# Patient Record
Sex: Female | Born: 1960 | Race: White | Hispanic: No | Marital: Single | Smoking: Former smoker
Health system: Southern US, Community
[De-identification: ages and names within clinical notes are randomized; demographics above are authoritative.]

## PROBLEM LIST (undated history)

## (undated) DIAGNOSIS — T7840XA Allergy, unspecified, initial encounter: Secondary | ICD-10-CM

## (undated) DIAGNOSIS — N183 Chronic kidney disease, stage 3 unspecified: Secondary | ICD-10-CM

## (undated) DIAGNOSIS — S82899A Other fracture of unspecified lower leg, initial encounter for closed fracture: Secondary | ICD-10-CM

## (undated) DIAGNOSIS — S93439A Sprain of tibiofibular ligament of unspecified ankle, initial encounter: Secondary | ICD-10-CM

## (undated) HISTORY — PX: TONSILLECTOMY: SUR1361

## (undated) HISTORY — PX: NASAL SINUS SURGERY: SHX719

## (undated) HISTORY — DX: Allergy, unspecified, initial encounter: T78.40XA

## (undated) HISTORY — PX: SINUS IRRIGATION: SHX2411

---

## 1997-12-30 ENCOUNTER — Ambulatory Visit (HOSPITAL_COMMUNITY): Admission: RE | Admit: 1997-12-30 | Discharge: 1997-12-30 | Payer: Self-pay

## 1998-01-06 ENCOUNTER — Ambulatory Visit (HOSPITAL_COMMUNITY): Admission: RE | Admit: 1998-01-06 | Discharge: 1998-01-06 | Payer: Self-pay | Admitting: *Deleted

## 2001-01-20 ENCOUNTER — Encounter: Payer: Self-pay | Admitting: *Deleted

## 2001-01-20 ENCOUNTER — Ambulatory Visit (HOSPITAL_COMMUNITY): Admission: RE | Admit: 2001-01-20 | Discharge: 2001-01-20 | Payer: Self-pay | Admitting: *Deleted

## 2001-02-04 ENCOUNTER — Encounter: Payer: Self-pay | Admitting: *Deleted

## 2001-02-04 ENCOUNTER — Encounter: Admission: RE | Admit: 2001-02-04 | Discharge: 2001-02-04 | Payer: Self-pay | Admitting: *Deleted

## 2005-09-21 ENCOUNTER — Emergency Department (HOSPITAL_COMMUNITY): Admission: EM | Admit: 2005-09-21 | Discharge: 2005-09-21 | Payer: Self-pay | Admitting: Family Medicine

## 2006-04-11 ENCOUNTER — Ambulatory Visit: Payer: Self-pay | Admitting: Allergy and Immunology

## 2007-07-07 ENCOUNTER — Emergency Department (HOSPITAL_COMMUNITY): Admission: EM | Admit: 2007-07-07 | Discharge: 2007-07-07 | Payer: Self-pay | Admitting: Family Medicine

## 2011-10-30 ENCOUNTER — Encounter: Payer: Self-pay | Admitting: Family Medicine

## 2011-10-30 ENCOUNTER — Ambulatory Visit (INDEPENDENT_AMBULATORY_CARE_PROVIDER_SITE_OTHER): Payer: BC Managed Care – PPO | Admitting: Family Medicine

## 2011-10-30 VITALS — BP 142/90 | HR 76 | Temp 98.1°F | Ht 62.75 in | Wt 184.0 lb

## 2011-10-30 DIAGNOSIS — R030 Elevated blood-pressure reading, without diagnosis of hypertension: Secondary | ICD-10-CM

## 2011-10-30 DIAGNOSIS — T7840XA Allergy, unspecified, initial encounter: Secondary | ICD-10-CM | POA: Insufficient documentation

## 2011-10-30 DIAGNOSIS — Z136 Encounter for screening for cardiovascular disorders: Secondary | ICD-10-CM

## 2011-10-30 DIAGNOSIS — J309 Allergic rhinitis, unspecified: Secondary | ICD-10-CM

## 2011-10-30 DIAGNOSIS — Z0000 Encounter for general adult medical examination without abnormal findings: Secondary | ICD-10-CM

## 2011-10-30 DIAGNOSIS — J302 Other seasonal allergic rhinitis: Secondary | ICD-10-CM | POA: Insufficient documentation

## 2011-10-30 LAB — COMPREHENSIVE METABOLIC PANEL
ALT: 16 U/L (ref 0–35)
AST: 19 U/L (ref 0–37)
Albumin: 3.9 g/dL (ref 3.5–5.2)
Alkaline Phosphatase: 52 U/L (ref 39–117)
BUN: 11 mg/dL (ref 6–23)
CO2: 26 mEq/L (ref 19–32)
Calcium: 8.9 mg/dL (ref 8.4–10.5)
Chloride: 106 mEq/L (ref 96–112)
Creatinine, Ser: 1.1 mg/dL (ref 0.4–1.2)
GFR: 55.85 mL/min — ABNORMAL LOW (ref >60.00)
Glucose, Bld: 85 mg/dL (ref 70–99)
Potassium: 3.9 mEq/L (ref 3.5–5.1)
Sodium: 139 mEq/L (ref 135–145)
Total Bilirubin: 0.6 mg/dL (ref 0.3–1.2)
Total Protein: 6.7 g/dL (ref 6.0–8.3)

## 2011-10-30 LAB — LIPID PANEL
Cholesterol: 149 mg/dL (ref 0–200)
HDL: 61.6 mg/dL (ref >39.00)
LDL Cholesterol: 57 mg/dL (ref 0–99)
Total CHOL/HDL Ratio: 2
Triglycerides: 154 mg/dL — ABNORMAL HIGH (ref 0.0–149.0)
VLDL: 30.8 mg/dL (ref 0.0–40.0)

## 2011-10-30 NOTE — Progress Notes (Signed)
Subjective:    Patient ID: [name], female    DOB: 1960/12/19, 51 y.o.   MRN: [id number]  HPI  Very pleasant 51 yo female here to establish care.  Has not been to a physician in several years.  Overdue for all prevention but does not have time for CPX today.  Seasonal allergies- has been on Xyzal and Singulair for years.  Prone to bronchitis during peak allergy season.  Has been quite congested this season.  Elevated BP- never been told she has HTN. No HA, blurred vision, CP, LE edema or SOB.  Does have family h/o HTN.  Patient Active Problem List  Diagnoses  . Elevated blood pressure reading without diagnosis of hypertension  . Seasonal allergies  . Allergy   Past Medical History  Diagnosis Date  . Allergy    Past Surgical History  Procedure Date  . Cesarean section   . Nasal sinus surgery    History  Substance Use Topics  . Smoking status: Former Games developer  . Smokeless tobacco: Not on file   Comment: Quit age 32.  Marland Kitchen Alcohol Use: Not on file   Family History  Problem Relation Age of Onset  . Alzheimer's disease Mother   . Hyperlipidemia Mother   . Hypertension Mother    Allergies  Allergen Reactions  . Asa (Aspirin) Swelling    In throat  . Sulfa Antibiotics Hives  . Penicillins Hives   Current Outpatient Prescriptions on File Prior to Visit  Medication Sig Dispense Refill  . levocetirizine (XYZAL) 5 MG tablet Take 5 mg by mouth every evening.      . montelukast (SINGULAIR) 10 MG tablet Take 10 mg by mouth at bedtime.       The PMH, PSH, Social History, Family History, Medications, and allergies have been reviewed in [facility], and have been updated if relevant.    Review of Systems See HPI Patient reports no  vision/ hearing changes,anorexia, weight change, fever ,adenopathy, persistant / recurrent hoarseness, swallowing issues, chest pain, edema,persistant / recurrent cough, focal weakness, severe memory loss, concerning skin lesions, depression,  anxiety, abnormal bruising/bleeding, major joint swelling, breast masses or abnormal vaginal bleeding.       Objective:   Physical Exam  BP 142/90  Pulse 76  Temp 98.1 F (36.7 C)  Ht 5' 2.75" (1.594 m)  Wt 184 lb (83.462 kg)  BMI 32.85 kg/m2  LMP 10/18/2011  General:  Well-developed,well-nourished,in no acute distress; alert,appropriate and cooperative throughout examination Head:  normocephalic and atraumatic.   Eyes:  vision grossly intact, pupils equal, pupils round, and pupils reactive to light.   Ears:  R ear normal and L ear normal.   Nose:  no external deformity, pos nasal mucosal erythema, no obstruction Mouth:  good dentition.   Neck:  No deformities, masses, or tenderness noted. Lungs:  Normal respiratory effort, chest expands symmetrically. Lungs are clear to auscultation, no crackles or wheezes. Heart:  Normal rate and regular rhythm. S1 and S2 normal without gallop, murmur, click, rub or other extra sounds. Msk:  No deformity or scoliosis noted of thoracic or lumbar spine.   Extremities:  No clubbing, cyanosis, edema, or deformity noted with normal full range of motion of all joints.   Neurologic:  alert & oriented X3 and gait normal.   Skin:  Intact without suspicious lesions or rashes Psych:  Cognition and judgment appear intact. Alert and cooperative with normal attention span and concentration. No apparent delusions, illusions, hallucinations    Assessment &  Plan:   1. Elevated blood pressure reading without diagnosis of hypertension  New- ? Possible white coat HTN. Does not have BP cuff at home, asymptomatic with only one reading. Will recheck at CPX (she plans to schedule this in next few weeks).   2. Seasonal allergies  Deteriorated but she would like to continue her current medications.

## 2011-10-30 NOTE — Patient Instructions (Signed)
Please make an appointment on your way out for complete physical- we will do a pap smear at that time.

## 2011-11-14 ENCOUNTER — Other Ambulatory Visit: Payer: Self-pay | Admitting: Family Medicine

## 2011-11-14 DIAGNOSIS — Z0000 Encounter for general adult medical examination without abnormal findings: Secondary | ICD-10-CM

## 2011-11-14 DIAGNOSIS — R030 Elevated blood-pressure reading, without diagnosis of hypertension: Secondary | ICD-10-CM

## 2011-11-14 DIAGNOSIS — Z136 Encounter for screening for cardiovascular disorders: Secondary | ICD-10-CM

## 2011-11-20 ENCOUNTER — Other Ambulatory Visit: Payer: BC Managed Care – PPO

## 2011-11-27 ENCOUNTER — Ambulatory Visit (INDEPENDENT_AMBULATORY_CARE_PROVIDER_SITE_OTHER): Payer: BC Managed Care – PPO | Admitting: Family Medicine

## 2011-11-27 ENCOUNTER — Encounter: Payer: Self-pay | Admitting: Family Medicine

## 2011-11-27 VITALS — BP 120/84 | HR 64 | Ht 62.75 in | Wt 182.0 lb

## 2011-11-27 DIAGNOSIS — Z0000 Encounter for general adult medical examination without abnormal findings: Secondary | ICD-10-CM

## 2011-11-27 DIAGNOSIS — Z01419 Encounter for gynecological examination (general) (routine) without abnormal findings: Secondary | ICD-10-CM | POA: Insufficient documentation

## 2011-11-27 LAB — POCT URINALYSIS DIPSTICK
Bilirubin, UA: NEGATIVE
Glucose, UA: NEGATIVE
Ketones, UA: NEGATIVE
Leukocytes, UA: NEGATIVE
pH, UA: 6

## 2011-11-27 NOTE — Addendum Note (Signed)
Addended by: Eliezer Bottom on: 11/27/2011 09:49 AM   Modules accepted: Orders

## 2011-11-27 NOTE — Progress Notes (Signed)
Commercial Driver Medical Examination   [name] is a 51 y.o. female who presents today for a commercial driver fitness determination physical exam. The patient reports no problems.  Patient Active Problem List  Diagnosis  . Elevated blood pressure reading without diagnosis of hypertension  . Seasonal allergies  . Allergy  . Routine general medical examination at a health care facility  . Routine gynecological examination   Past Medical History  Diagnosis Date  . Allergy    Past Surgical History  Procedure Date  . Cesarean section   . Nasal sinus surgery    History  Substance Use Topics  . Smoking status: Former Games developer  . Smokeless tobacco: Not on file   Comment: Quit age 64.  Marland Kitchen Alcohol Use: Not on file   Family History  Problem Relation Age of Onset  . Alzheimer's disease Mother   . Hyperlipidemia Mother   . Hypertension Mother    Allergies  Allergen Reactions  . Asa (Aspirin) Swelling    In throat  . Sulfa Antibiotics Hives  . Penicillins Hives   Current Outpatient Prescriptions on File Prior to Visit  Medication Sig Dispense Refill  . levocetirizine (XYZAL) 5 MG tablet Take 5 mg by mouth every evening.      . montelukast (SINGULAIR) 10 MG tablet Take 10 mg by mouth at bedtime.       The PMH, PSH, Social History, Family History, Medications, and allergies have been reviewed in [facility], and have been updated if relevant.  Review of Systems A comprehensive review of systems was negative.   Objective:    Vision:  Uncorrected Corrected  Right Eye  20/20  Left Eye   20/20  Both Eyes   20/20   Applicant can recognize and distinguish among traffic control signals and devices showing standard red, green, and amber colors.  Applicant meets visual acuity requirement only when wearing corrective lenses.   Hearing:   500 Hz 1000 Hz 2000 Hz 4000 Hz  Right Ear  hears 20 dB hears 20 dB hears 20 dB hears 20 dB  Left Ear  hears 20 dB hears 20 dB hears 20 dB  hears 20 dB    BP 120/84  Pulse 64  Ht 5' 2.75" (1.594 m)  Wt 182 lb (82.555 kg)  BMI 32.50 kg/m2  LMP 10/18/2011  General Appearance:    Alert, cooperative, no distress, appears stated age  Head:    Normocephalic, without obvious abnormality, atraumatic  Eyes:    PERRL, conjunctiva/corneas clear, EOM's intact, fundi    benign, both eyes  Ears:    Normal TM's and external ear canals, both ears  Nose:   Nares normal, septum midline, mucosa normal, no drainage    or sinus tenderness  Throat:   Lips, mucosa, and tongue normal; teeth and gums normal  Neck:   Supple, symmetrical, trachea midline, no adenopathy;    thyroid:  no enlargement/tenderness/nodules; no carotid   bruit or JVD  Back:     Symmetric, no curvature, ROM normal, no CVA tenderness  Lungs:     Clear to auscultation bilaterally, respirations unlabored  Chest Wall:    No tenderness or deformity   Heart:    Regular rate and rhythm, S1 and S2 normal, no murmur, rub   or gallop  Extremities:   Extremities normal, atraumatic, no cyanosis or edema  Pulses:   2+ and symmetric all extremities  Skin:   Skin color, texture, turgor normal, no rashes or lesions  Lymph nodes:   Cervical, supraclavicular, and axillary nodes normal  Neurologic:   CNII-XII intact, normal strength, sensation and reflexes    throughout    Labs: UA neg    Assessment:    Healthy female exam.  Meets standards in 56 CFR 391.41;  qualifies for 2 year certificate.    Plan:    Medical examiners certificate completed and printed. Return as needed.

## 2017-01-24 ENCOUNTER — Encounter (HOSPITAL_COMMUNITY): Payer: Self-pay | Admitting: Pharmacy Technician

## 2017-01-24 ENCOUNTER — Encounter (HOSPITAL_COMMUNITY)
Admission: EM | Disposition: A | Discharge status: Home Health | Payer: Self-pay | Source: Home / Self Care | Attending: Orthopedic Surgery

## 2017-01-24 ENCOUNTER — Inpatient Hospital Stay (HOSPITAL_COMMUNITY)
Admission: EM | Admit: 2017-01-24 | Discharge: 2017-01-31 | DRG: 494 | Disposition: A | Discharge status: Home Health | Payer: Worker's Compensation | Attending: Orthopedic Surgery | Admitting: Orthopedic Surgery

## 2017-01-24 ENCOUNTER — Emergency Department (HOSPITAL_COMMUNITY): Payer: Worker's Compensation | Admitting: Anesthesiology

## 2017-01-24 ENCOUNTER — Emergency Department (HOSPITAL_COMMUNITY): Payer: Worker's Compensation

## 2017-01-24 DIAGNOSIS — Z880 Allergy status to penicillin: Secondary | ICD-10-CM

## 2017-01-24 DIAGNOSIS — Z886 Allergy status to analgesic agent status: Secondary | ICD-10-CM

## 2017-01-24 DIAGNOSIS — Z7951 Long term (current) use of inhaled steroids: Secondary | ICD-10-CM

## 2017-01-24 DIAGNOSIS — S82852B Displaced trimalleolar fracture of left lower leg, initial encounter for open fracture type I or II: Principal | ICD-10-CM | POA: Diagnosis present

## 2017-01-24 DIAGNOSIS — S8992XA Unspecified injury of left lower leg, initial encounter: Secondary | ICD-10-CM | POA: Diagnosis present

## 2017-01-24 DIAGNOSIS — Z885 Allergy status to narcotic agent status: Secondary | ICD-10-CM

## 2017-01-24 DIAGNOSIS — Z882 Allergy status to sulfonamides status: Secondary | ICD-10-CM

## 2017-01-24 DIAGNOSIS — Z419 Encounter for procedure for purposes other than remedying health state, unspecified: Secondary | ICD-10-CM

## 2017-01-24 DIAGNOSIS — Z23 Encounter for immunization: Secondary | ICD-10-CM

## 2017-01-24 DIAGNOSIS — S99912A Unspecified injury of left ankle, initial encounter: Secondary | ICD-10-CM | POA: Diagnosis not present

## 2017-01-24 DIAGNOSIS — S83412A Sprain of medial collateral ligament of left knee, initial encounter: Secondary | ICD-10-CM | POA: Diagnosis present

## 2017-01-24 DIAGNOSIS — S82899A Other fracture of unspecified lower leg, initial encounter for closed fracture: Secondary | ICD-10-CM

## 2017-01-24 HISTORY — DX: Other fracture of unspecified lower leg, initial encounter for closed fracture: S82.899A

## 2017-01-24 HISTORY — PX: I & D EXTREMITY: SHX5045

## 2017-01-24 LAB — CBC WITH DIFFERENTIAL/PLATELET
BASOS PCT: 1 %
Basophils Absolute: 0 10*3/uL (ref 0.0–0.1)
EOS ABS: 0.6 10*3/uL (ref 0.0–0.7)
EOS PCT: 9 %
HCT: 40.3 % (ref 36.0–46.0)
HEMOGLOBIN: 13.7 g/dL (ref 12.0–15.0)
Lymphocytes Relative: 30 %
Lymphs Abs: 2.1 10*3/uL (ref 0.7–4.0)
MCH: 27.6 pg (ref 26.0–34.0)
MCHC: 34 g/dL (ref 30.0–36.0)
MCV: 81.3 fL (ref 78.0–100.0)
MONOS PCT: 6 %
Monocytes Absolute: 0.4 10*3/uL (ref 0.1–1.0)
NEUTROS PCT: 54 %
Neutro Abs: 3.8 10*3/uL (ref 1.7–7.7)
PLATELETS: 239 10*3/uL (ref 150–400)
RBC: 4.96 MIL/uL (ref 3.87–5.11)
RDW: 13.1 % (ref 11.5–15.5)
WBC: 6.9 10*3/uL (ref 4.0–10.5)

## 2017-01-24 LAB — I-STAT BETA HCG BLOOD, ED (MC, WL, AP ONLY): I-stat hCG, quantitative: <5 m[IU]/mL (ref <5)

## 2017-01-24 LAB — I-STAT CHEM 8, ED
BUN: 16 mg/dL (ref 6–20)
Calcium, Ion: 1.14 mmol/L — ABNORMAL LOW (ref 1.15–1.40)
Chloride: 106 mmol/L (ref 101–111)
Creatinine, Ser: 1.3 mg/dL — ABNORMAL HIGH (ref 0.44–1.00)
Glucose, Bld: 139 mg/dL — ABNORMAL HIGH (ref 65–99)
HEMATOCRIT: 40 % (ref 36.0–46.0)
HEMOGLOBIN: 13.6 g/dL (ref 12.0–15.0)
Potassium: 3.2 mmol/L — ABNORMAL LOW (ref 3.5–5.1)
SODIUM: 142 mmol/L (ref 135–145)
TCO2: 23 mmol/L (ref 22–32)

## 2017-01-24 LAB — ETHANOL: Alcohol, Ethyl (B): <5 mg/dL (ref <5)

## 2017-01-24 LAB — TYPE AND SCREEN
ABO/RH(D): B POS
ANTIBODY SCREEN: NEGATIVE

## 2017-01-24 LAB — ABO/RH: ABO/RH(D): B POS

## 2017-01-24 SURGERY — IRRIGATION AND DEBRIDEMENT EXTREMITY
Anesthesia: General | Site: Ankle | Laterality: Left

## 2017-01-24 MED ORDER — CLINDAMYCIN PHOSPHATE 600 MG/50ML IV SOLN
600.0000 mg | Freq: Once | INTRAVENOUS | Status: AC
  Administered 2017-01-24: 600 mg via INTRAVENOUS
  Filled 2017-01-24: qty 50

## 2017-01-24 MED ORDER — HYDROMORPHONE HCL 1 MG/ML IJ SOLN
1.0000 mg | Freq: Once | INTRAMUSCULAR | Status: AC
  Administered 2017-01-24: 1 mg via INTRAVENOUS
  Filled 2017-01-24: qty 1

## 2017-01-24 MED ORDER — CLINDAMYCIN PHOSPHATE 900 MG/50ML IV SOLN
INTRAVENOUS | Status: AC
  Filled 2017-01-24: qty 50

## 2017-01-24 MED ORDER — PROPOFOL 10 MG/ML IV BOLUS
0.5000 mg/kg | Freq: Once | INTRAVENOUS | Status: DC
  Filled 2017-01-24: qty 20

## 2017-01-24 MED ORDER — FENTANYL CITRATE (PF) 250 MCG/5ML IJ SOLN
INTRAMUSCULAR | Status: DC | PRN
  Administered 2017-01-24 – 2017-01-25 (×2): 50 ug via INTRAVENOUS

## 2017-01-24 MED ORDER — MIDAZOLAM HCL 2 MG/2ML IJ SOLN
INTRAMUSCULAR | Status: AC
  Filled 2017-01-24: qty 2

## 2017-01-24 MED ORDER — LACTATED RINGERS IV SOLN
INTRAVENOUS | Status: DC | PRN
  Administered 2017-01-24 – 2017-01-25 (×3): via INTRAVENOUS

## 2017-01-24 MED ORDER — MIDAZOLAM HCL 5 MG/5ML IJ SOLN
INTRAMUSCULAR | Status: DC | PRN
  Administered 2017-01-24 (×2): 1 mg via INTRAVENOUS

## 2017-01-24 MED ORDER — FENTANYL CITRATE (PF) 250 MCG/5ML IJ SOLN
INTRAMUSCULAR | Status: AC
  Filled 2017-01-24: qty 5

## 2017-01-24 MED ORDER — PROPOFOL 10 MG/ML IV BOLUS
INTRAVENOUS | Status: AC | PRN
  Administered 2017-01-24: 70 mg via INTRAVENOUS

## 2017-01-24 SURGICAL SUPPLY — 80 items
BANDAGE ACE 3X5.8 VEL STRL LF (GAUZE/BANDAGES/DRESSINGS) IMPLANT
BIT DRILL 2.5X110 QC LCP DISP (BIT) ×2 IMPLANT
BIT DRILL CANN 2.7X625 NONSTRL (BIT) ×2 IMPLANT
BLADE SURG 10 STRL SS (BLADE) ×3 IMPLANT
BNDG COHESIVE 1X5 TAN STRL LF (GAUZE/BANDAGES/DRESSINGS) IMPLANT
BNDG COHESIVE 4X5 TAN STRL (GAUZE/BANDAGES/DRESSINGS) ×3 IMPLANT
BNDG COHESIVE 6X5 TAN STRL LF (GAUZE/BANDAGES/DRESSINGS) ×4 IMPLANT
BNDG CONFORM 3 STRL LF (GAUZE/BANDAGES/DRESSINGS) IMPLANT
BNDG GAUZE STRTCH 6 (GAUZE/BANDAGES/DRESSINGS) ×9 IMPLANT
CLOSURE STERI-STRIP 1/4X4 (GAUZE/BANDAGES/DRESSINGS) ×2 IMPLANT
CORDS BIPOLAR (ELECTRODE) IMPLANT
COVER SURGICAL LIGHT HANDLE (MISCELLANEOUS) ×3 IMPLANT
CUFF TOURNIQUET SINGLE 24IN (TOURNIQUET CUFF) IMPLANT
CUFF TOURNIQUET SINGLE 34IN LL (TOURNIQUET CUFF) ×4 IMPLANT
CUFF TOURNIQUET SINGLE 44IN (TOURNIQUET CUFF) IMPLANT
DRAPE EXTREMITY BILATERAL (DRAPES) IMPLANT
DRAPE IMP U-DRAPE 54X76 (DRAPES) IMPLANT
DRAPE INCISE IOBAN 66X45 STRL (DRAPES) ×4 IMPLANT
DRAPE SURG 17X23 STRL (DRAPES) IMPLANT
DRAPE U-SHAPE 47X51 STRL (DRAPES) ×3 IMPLANT
DRSG ADAPTIC 3X8 NADH LF (GAUZE/BANDAGES/DRESSINGS) ×2 IMPLANT
DURAPREP 26ML APPLICATOR (WOUND CARE) ×1 IMPLANT
ELECT CAUTERY BLADE 6.4 (BLADE) ×3 IMPLANT
ELECT REM PT RETURN 9FT ADLT (ELECTROSURGICAL) ×3
ELECTRODE REM PT RTRN 9FT ADLT (ELECTROSURGICAL) IMPLANT
FACESHIELD WRAPAROUND (MASK) IMPLANT
FACESHIELD WRAPAROUND OR TEAM (MASK) IMPLANT
GAUZE SPONGE 4X4 12PLY STRL (GAUZE/BANDAGES/DRESSINGS) ×4 IMPLANT
GAUZE XEROFORM 1X8 LF (GAUZE/BANDAGES/DRESSINGS) ×3 IMPLANT
GAUZE XEROFORM 5X9 LF (GAUZE/BANDAGES/DRESSINGS) ×3 IMPLANT
GLOVE BIO SURGEON STRL SZ7.5 (GLOVE) ×3 IMPLANT
GLOVE BIOGEL PI IND STRL 8 (GLOVE) ×1 IMPLANT
GLOVE BIOGEL PI INDICATOR 8 (GLOVE) ×2
GOWN STRL REUS W/ TWL LRG LVL3 (GOWN DISPOSABLE) ×2 IMPLANT
GOWN STRL REUS W/ TWL XL LVL3 (GOWN DISPOSABLE) ×1 IMPLANT
GOWN STRL REUS W/TWL LRG LVL3 (GOWN DISPOSABLE) ×6
GOWN STRL REUS W/TWL XL LVL3 (GOWN DISPOSABLE) ×3
HANDPIECE INTERPULSE COAX TIP (DISPOSABLE) ×3
K-WIRE 1.25 TRCR POINT 150 (WIRE) ×6
KIT BASIN OR (CUSTOM PROCEDURE TRAY) ×3 IMPLANT
KIT ROOM TURNOVER OR (KITS) ×3 IMPLANT
KWIRE 1.25 TRCR POINT 150 (WIRE) IMPLANT
MANIFOLD NEPTUNE II (INSTRUMENTS) ×3 IMPLANT
NS IRRIG 1000ML POUR BTL (IV SOLUTION) ×6 IMPLANT
PACK ORTHO EXTREMITY (CUSTOM PROCEDURE TRAY) ×3 IMPLANT
PAD ABD 8X10 STRL (GAUZE/BANDAGES/DRESSINGS) ×3 IMPLANT
PAD ARMBOARD 7.5X6 YLW CONV (MISCELLANEOUS) ×6 IMPLANT
PADDING CAST ABS 4INX4YD NS (CAST SUPPLIES) ×4
PADDING CAST ABS COTTON 4X4 ST (CAST SUPPLIES) ×2 IMPLANT
PADDING CAST COTTON 6X4 STRL (CAST SUPPLIES) ×3 IMPLANT
PLATE LCP 3.5 1/3 TUB 10HX117 (Plate) ×2 IMPLANT
SCREW CANC FT ST SFS 4X16 (Screw) ×2 IMPLANT
SCREW CORTEX 3.5 12MM (Screw) ×2 IMPLANT
SCREW CORTEX 3.5 14MM (Screw) ×8 IMPLANT
SCREW CORTEX 3.5 16MM (Screw) ×2 IMPLANT
SCREW LOCK CORT ST 3.5X12 (Screw) IMPLANT
SCREW LOCK CORT ST 3.5X14 (Screw) IMPLANT
SCREW LOCK CORT ST 3.5X16 (Screw) IMPLANT
SCREW SHORT THREAD 4.0X40 (Screw) ×4 IMPLANT
SET HNDPC FAN SPRY TIP SCT (DISPOSABLE) IMPLANT
SPLINT PLASTER EXTRA FAST 3X15 (CAST SUPPLIES) ×2
SPLINT PLASTER GYPS XFAST 3X15 (CAST SUPPLIES) IMPLANT
SPONGE LAP 18X18 X RAY DECT (DISPOSABLE) ×2 IMPLANT
STOCKINETTE IMPERVIOUS 9X36 MD (GAUZE/BANDAGES/DRESSINGS) ×3 IMPLANT
SUT ETHILON 2 0 FS 18 (SUTURE) IMPLANT
SUT ETHILON 2 0 PSLX (SUTURE) IMPLANT
SUT ETHILON 3 0 PS 1 (SUTURE) IMPLANT
SUT VIC AB 2-0 CT1 36 (SUTURE) IMPLANT
SUT VIC AB 2-0 FS1 27 (SUTURE) IMPLANT
SWAB CULTURE ESWAB REG 1ML (MISCELLANEOUS) IMPLANT
SYR CONTROL 10ML LL (SYRINGE) IMPLANT
TOWEL OR 17X24 6PK STRL BLUE (TOWEL DISPOSABLE) ×3 IMPLANT
TOWEL OR 17X26 10 PK STRL BLUE (TOWEL DISPOSABLE) ×3 IMPLANT
TUBE CONNECTING 12'X1/4 (SUCTIONS) ×1
TUBE CONNECTING 12X1/4 (SUCTIONS) ×2 IMPLANT
TUBE FEEDING ENTERAL 5FR 16IN (TUBING) IMPLANT
TUBING CYSTO DISP (UROLOGICAL SUPPLIES) ×3 IMPLANT
UNDERPAD 30X30 (UNDERPADS AND DIAPERS) ×6 IMPLANT
WATER STERILE IRR 1000ML POUR (IV SOLUTION) ×1 IMPLANT
YANKAUER SUCT BULB TIP NO VENT (SUCTIONS) ×3 IMPLANT
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
## 2017-01-24 NOTE — Anesthesia Preprocedure Evaluation (Addendum)
Anesthesia Evaluation  Patient identified by MRN, date of birth, ID band Patient awake    Reviewed: Allergy & Precautions, NPO status , Patient's Chart, lab work & pertinent test results  Airway Mallampati: II  TM Distance: >3 FB     Dental  (+) Teeth Intact, Dental Advisory Given   Pulmonary neg pulmonary ROS,    breath sounds clear to auscultation       Cardiovascular negative cardio ROS   Rhythm:Regular Rate:Normal     Neuro/Psych negative neurological ROS     GI/Hepatic negative GI ROS, Neg liver ROS,   Endo/Other  negative endocrine ROS  Renal/GU negative Renal ROS     Musculoskeletal   Abdominal   Peds  Hematology   Anesthesia Other Findings   Reproductive/Obstetrics                            Anesthesia Physical Anesthesia Plan  ASA: II and emergent  Anesthesia Plan: General   Post-op Pain Management:    Induction: Intravenous  PONV Risk Score and Plan: 3 and Ondansetron, Midazolam, Propofol infusion and Treatment may vary due to age or medical condition  Airway Management Planned: Oral ETT  Additional Equipment:   Intra-op Plan:   Post-operative Plan: Extubation in OR  Informed Consent: I have reviewed the patients History and Physical, chart, labs and discussed the procedure including the risks, benefits and alternatives for the proposed anesthesia with the patient or authorized representative who has indicated his/her understanding and acceptance.   Dental advisory given  Plan Discussed with: CRNA, Anesthesiologist and Surgeon  Anesthesia Plan Comments:        Anesthesia Quick Evaluation

## 2017-01-24 NOTE — Progress Notes (Signed)
   01/24/17 2020  Clinical Encounter Type  Visited With Patient and family together  Visit Type ED  Spiritual Encounters  Spiritual Needs Emotional  Stress Factors  Patient Stress Factors Health changes  Family Stress Factors None identified  Introduction to son as Pt going through procedures. No needs requested.

## 2017-01-25 ENCOUNTER — Emergency Department (HOSPITAL_COMMUNITY): Payer: Worker's Compensation

## 2017-01-25 ENCOUNTER — Inpatient Hospital Stay (HOSPITAL_COMMUNITY): Payer: Worker's Compensation

## 2017-01-25 DIAGNOSIS — S83412A Sprain of medial collateral ligament of left knee, initial encounter: Secondary | ICD-10-CM | POA: Diagnosis present

## 2017-01-25 DIAGNOSIS — Z882 Allergy status to sulfonamides status: Secondary | ICD-10-CM | POA: Diagnosis not present

## 2017-01-25 DIAGNOSIS — S8992XA Unspecified injury of left lower leg, initial encounter: Secondary | ICD-10-CM | POA: Diagnosis present

## 2017-01-25 DIAGNOSIS — Z23 Encounter for immunization: Secondary | ICD-10-CM | POA: Diagnosis not present

## 2017-01-25 DIAGNOSIS — S99912A Unspecified injury of left ankle, initial encounter: Secondary | ICD-10-CM | POA: Diagnosis present

## 2017-01-25 DIAGNOSIS — Z886 Allergy status to analgesic agent status: Secondary | ICD-10-CM | POA: Diagnosis not present

## 2017-01-25 DIAGNOSIS — S82852B Displaced trimalleolar fracture of left lower leg, initial encounter for open fracture type I or II: Secondary | ICD-10-CM | POA: Diagnosis present

## 2017-01-25 DIAGNOSIS — Z885 Allergy status to narcotic agent status: Secondary | ICD-10-CM | POA: Diagnosis not present

## 2017-01-25 DIAGNOSIS — Z7951 Long term (current) use of inhaled steroids: Secondary | ICD-10-CM | POA: Diagnosis not present

## 2017-01-25 DIAGNOSIS — Z880 Allergy status to penicillin: Secondary | ICD-10-CM | POA: Diagnosis not present

## 2017-01-25 HISTORY — PX: IRRIGATION AND DEBRIDEMENT FOOT: SHX6602

## 2017-01-25 LAB — CBC
HCT: 37 % (ref 36.0–46.0)
HEMOGLOBIN: 12.3 g/dL (ref 12.0–15.0)
MCH: 27.2 pg (ref 26.0–34.0)
MCHC: 33.2 g/dL (ref 30.0–36.0)
MCV: 81.7 fL (ref 78.0–100.0)
PLATELETS: 199 10*3/uL (ref 150–400)
RBC: 4.53 MIL/uL (ref 3.87–5.11)
RDW: 13.1 % (ref 11.5–15.5)
WBC: 9.3 10*3/uL (ref 4.0–10.5)

## 2017-01-25 LAB — BASIC METABOLIC PANEL
ANION GAP: 9 (ref 5–15)
BUN: 11 mg/dL (ref 6–20)
CHLORIDE: 107 mmol/L (ref 101–111)
CO2: 23 mmol/L (ref 22–32)
Calcium: 8.3 mg/dL — ABNORMAL LOW (ref 8.9–10.3)
Creatinine, Ser: 1.08 mg/dL — ABNORMAL HIGH (ref 0.44–1.00)
GFR calc Af Amer: >60 mL/min (ref >60)
GFR, EST NON AFRICAN AMERICAN: 55 mL/min — AB (ref >60)
GLUCOSE: 197 mg/dL — AB (ref 65–99)
POTASSIUM: 4 mmol/L (ref 3.5–5.1)
SODIUM: 139 mmol/L (ref 135–145)

## 2017-01-25 LAB — HIV ANTIBODY (ROUTINE TESTING W REFLEX): HIV Screen 4th Generation wRfx: NONREACTIVE

## 2017-01-25 MED ORDER — SUCCINYLCHOLINE CHLORIDE 20 MG/ML IJ SOLN
INTRAMUSCULAR | Status: DC | PRN
  Administered 2017-01-24: 120 mg via INTRAVENOUS

## 2017-01-25 MED ORDER — SENNA 8.6 MG PO TABS
1.0000 | ORAL_TABLET | Freq: Two times a day (BID) | ORAL | Status: DC
  Administered 2017-01-25 – 2017-01-31 (×13): 8.6 mg via ORAL
  Filled 2017-01-25 (×13): qty 1

## 2017-01-25 MED ORDER — FENTANYL CITRATE (PF) 100 MCG/2ML IJ SOLN
INTRAMUSCULAR | Status: AC
  Filled 2017-01-25: qty 2

## 2017-01-25 MED ORDER — MORPHINE SULFATE (PF) 2 MG/ML IV SOLN: 2.0000 mg | INTRAVENOUS | Status: DC | PRN

## 2017-01-25 MED ORDER — ONDANSETRON HCL 4 MG PO TABS: 4.0000 mg | ORAL_TABLET | Freq: Four times a day (QID) | ORAL | 0 refills | Status: DC | PRN

## 2017-01-25 MED ORDER — ONDANSETRON HCL 4 MG/2ML IJ SOLN
4.0000 mg | Freq: Four times a day (QID) | INTRAMUSCULAR | Status: DC | PRN
  Administered 2017-01-25: 4 mg via INTRAVENOUS
  Filled 2017-01-25: qty 2

## 2017-01-25 MED ORDER — METHOCARBAMOL 500 MG PO TABS
500.0000 mg | ORAL_TABLET | Freq: Four times a day (QID) | ORAL | Status: DC | PRN
  Administered 2017-01-25 – 2017-01-30 (×9): 500 mg via ORAL
  Filled 2017-01-25 (×10): qty 1

## 2017-01-25 MED ORDER — SODIUM CHLORIDE 0.9 % IR SOLN
Status: DC | PRN
  Administered 2017-01-25: 3000 mL

## 2017-01-25 MED ORDER — ACETAMINOPHEN 325 MG PO TABS
650.0000 mg | ORAL_TABLET | Freq: Four times a day (QID) | ORAL | Status: DC | PRN
  Administered 2017-01-25 – 2017-01-26 (×3): 650 mg via ORAL
  Filled 2017-01-25 (×5): qty 2

## 2017-01-25 MED ORDER — LIDOCAINE HCL (CARDIAC) 20 MG/ML IV SOLN
INTRAVENOUS | Status: DC | PRN
  Administered 2017-01-24: 100 mg via INTRATRACHEAL

## 2017-01-25 MED ORDER — ALBUTEROL SULFATE (2.5 MG/3ML) 0.083% IN NEBU: 3.0000 mL | INHALATION_SOLUTION | Freq: Four times a day (QID) | RESPIRATORY_TRACT | Status: DC | PRN

## 2017-01-25 MED ORDER — EPHEDRINE SULFATE 50 MG/ML IJ SOLN
INTRAMUSCULAR | Status: DC | PRN
  Administered 2017-01-25 (×2): 5 mg via INTRAVENOUS

## 2017-01-25 MED ORDER — ONDANSETRON HCL 4 MG/2ML IJ SOLN
INTRAMUSCULAR | Status: AC
  Administered 2017-01-25: 4 mg via INTRAVENOUS
  Filled 2017-01-25: qty 2

## 2017-01-25 MED ORDER — ACETAMINOPHEN 650 MG RE SUPP: 650.0000 mg | Freq: Four times a day (QID) | RECTAL | Status: DC | PRN

## 2017-01-25 MED ORDER — ONDANSETRON HCL 4 MG/2ML IJ SOLN
INTRAMUSCULAR | Status: DC | PRN
  Administered 2017-01-25: 4 mg via INTRAVENOUS

## 2017-01-25 MED ORDER — FENTANYL CITRATE (PF) 100 MCG/2ML IJ SOLN
25.0000 ug | INTRAMUSCULAR | Status: DC | PRN
  Administered 2017-01-25 (×2): 50 ug via INTRAVENOUS

## 2017-01-25 MED ORDER — ENOXAPARIN SODIUM 40 MG/0.4ML ~~LOC~~ SOLN: 40.0000 mg | SUBCUTANEOUS | 0 refills | Status: DC

## 2017-01-25 MED ORDER — METHOCARBAMOL 1000 MG/10ML IJ SOLN
500.0000 mg | Freq: Four times a day (QID) | INTRAVENOUS | Status: DC | PRN
  Filled 2017-01-25: qty 5

## 2017-01-25 MED ORDER — ENOXAPARIN SODIUM 40 MG/0.4ML ~~LOC~~ SOLN
40.0000 mg | SUBCUTANEOUS | Status: DC
  Administered 2017-01-25 – 2017-01-30 (×6): 40 mg via SUBCUTANEOUS
  Filled 2017-01-25 (×6): qty 0.4

## 2017-01-25 MED ORDER — CLINDAMYCIN PHOSPHATE 900 MG/50ML IV SOLN
INTRAVENOUS | Status: DC | PRN
  Administered 2017-01-25: 900 mg via INTRAVENOUS

## 2017-01-25 MED ORDER — PROPOFOL 10 MG/ML IV BOLUS
INTRAVENOUS | Status: DC | PRN
  Administered 2017-01-24: 170 mg via INTRAVENOUS

## 2017-01-25 MED ORDER — OXYCODONE HCL 5 MG PO TABS: 5.0000 mg | ORAL_TABLET | ORAL | 0 refills | Status: DC | PRN

## 2017-01-25 MED ORDER — OXYCODONE HCL 5 MG PO TABS
5.0000 mg | ORAL_TABLET | ORAL | Status: DC | PRN
  Administered 2017-01-25: 5 mg via ORAL
  Administered 2017-01-25 – 2017-01-27 (×8): 10 mg via ORAL
  Administered 2017-01-27: 5 mg via ORAL
  Administered 2017-01-27 – 2017-01-31 (×14): 10 mg via ORAL
  Filled 2017-01-25 (×12): qty 2
  Filled 2017-01-25: qty 1
  Filled 2017-01-25 (×13): qty 2

## 2017-01-25 MED ORDER — ONDANSETRON HCL 4 MG PO TABS: 4.0000 mg | ORAL_TABLET | Freq: Four times a day (QID) | ORAL | Status: DC | PRN

## 2017-01-25 MED ORDER — BISACODYL 10 MG RE SUPP: 10.0000 mg | Freq: Every day | RECTAL | Status: DC | PRN

## 2017-01-25 MED ORDER — PHENYLEPHRINE HCL 10 MG/ML IJ SOLN
INTRAMUSCULAR | Status: DC | PRN
  Administered 2017-01-25 (×2): 40 ug via INTRAVENOUS

## 2017-01-25 MED ORDER — MORPHINE SULFATE (PF) 4 MG/ML IV SOLN
2.0000 mg | INTRAVENOUS | Status: DC | PRN
  Administered 2017-01-25 – 2017-01-29 (×7): 2 mg via INTRAVENOUS
  Filled 2017-01-25 (×7): qty 1

## 2017-01-25 NOTE — Anesthesia Postprocedure Evaluation (Signed)
Anesthesia Post Note  Patient: Wynonia LawmanSharon Genther  Procedure(s) Performed: Procedure(s) (LRB): IRRIGATION AND DEBRIDEMENT LEFT ANKLE, POSSIBLE CLOSE REDUCTION, POSSIBLE ORIF (Left)     Patient location during evaluation: PACU Anesthesia Type: General Level of consciousness: awake Pain management: pain level controlled Vital Signs Assessment: post-procedure vital signs reviewed and stable Respiratory status: spontaneous breathing Cardiovascular status: stable Anesthetic complications: no    Last Vitals:  Vitals:   01/24/17 2220 01/24/17 2228  BP: 128/67 127/75  Pulse: 78 69  Resp: (!) 24 12  Temp:    SpO2: 100% 98%    Last Pain:  Vitals:   01/24/17 2032  TempSrc: Oral  PainSc: 10-Worst pain ever                 Ahaana Rochette

## 2017-01-25 NOTE — Anesthesia Procedure Notes (Signed)
Procedure Name: Intubation Date/Time: 01/25/2017 12:07 AM Performed by: Brien MatesMAHONY, Emelly Wurtz D Pre-anesthesia Checklist: Patient identified, Emergency Drugs available, Suction available, Patient being monitored and Timeout performed Patient Re-evaluated:Patient Re-evaluated prior to induction Oxygen Delivery Method: Circle system utilized Preoxygenation: Pre-oxygenation with 100% oxygen Induction Type: IV induction, Rapid sequence and Cricoid Pressure applied Laryngoscope Size: Miller and 2 Grade View: Grade I Tube type: Oral Tube size: 7.5 mm Number of attempts: 1 Airway Equipment and Method: Stylet Placement Confirmation: ETT inserted through vocal cords under direct vision,  positive ETCO2 and breath sounds checked- equal and bilateral Secured at: 21 cm Tube secured with: Tape Dental Injury: Teeth and Oropharynx as per pre-operative assessment

## 2017-01-25 NOTE — Transfer of Care (Signed)
Immediate Anesthesia Transfer of Care Note  Patient: [name]  Procedure(s) Performed: Procedure(s): IRRIGATION AND DEBRIDEMENT LEFT ANKLE, LEFT ANKLE ORIF (Left)  Patient Location: PACU  Anesthesia Type:General  Level of Consciousness: drowsy  Airway & Oxygen Therapy: Patient Spontanous Breathing and Patient connected to nasal cannula oxygen  Post-op Assessment: Report given to RN and Post -op Vital signs reviewed and stable  Post vital signs: Reviewed and stable  Last Vitals:  Vitals:   01/24/17 2220 01/24/17 2228  BP: 128/67 127/75  Pulse: 78 69  Resp: (!) 24 12  Temp:    SpO2: 100% 98%    Last Pain:  Vitals:   01/24/17 2032  TempSrc: Oral  PainSc: 10-Worst pain ever         Complications: No apparent anesthesia complications

## 2017-01-25 NOTE — Progress Notes (Signed)
OT Cancellation Note  Patient Details Name: Kelly LawmanSharon Muldrow MRN: [id number] DOB: 01/11/1961   Cancelled Treatment:    Reason Eval/Treat Not Completed: Patient at procedure or test/ unavailable. Pt leaving unit for test on OT arrival. Will check back as able to initiate evaluation.   Doristine Sectionharity A Tiawana Forgy, MS OTR/L  Pager: [phone]   Chari Parmenter A Ramiya Delahunty 01/25/2017, 12:08 PM
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
## 2017-01-25 NOTE — ED Notes (Signed)
Pt giving verbal consent for conscious sedation and reduction of L ankle to both Dr. Rubin PayorPickering and Dr. Aundria Rudogers.

## 2017-01-25 NOTE — Anesthesia Postprocedure Evaluation (Signed)
Anesthesia Post Note  Patient: [name]  Procedure(s) Performed: Procedure(s) (LRB): IRRIGATION AND DEBRIDEMENT LEFT ANKLE, LEFT ANKLE ORIF (Left)     Patient location during evaluation: PACU Anesthesia Type: General Level of consciousness: awake Pain management: pain level controlled Vital Signs Assessment: post-procedure vital signs reviewed and stable Respiratory status: spontaneous breathing Cardiovascular status: stable Anesthetic complications: no    Last Vitals:  Vitals:   01/24/17 2228 01/25/17 0210  BP: 127/75 (!) 144/83  Pulse: 69 96  Resp: 12 (!) 25  Temp:  (!) 36.4 C  SpO2: 98% 97%    Last Pain:  Vitals:   01/24/17 2032  TempSrc: Oral  PainSc: 10-Worst pain ever                 Clarabelle Oscarson

## 2017-01-25 NOTE — Brief Op Note (Signed)
01/24/2017 - 01/25/2017  2:13 AM  PATIENT:  [name]  56 y.o. female  PRE-OPERATIVE DIAGNOSIS:  OPEN FRACTURE, LEFT ANKLE  POST-OPERATIVE DIAGNOSIS:  OPEN FRACTURE, LEFT ANKLE  PROCEDURE:  Procedure(s): IRRIGATION AND DEBRIDEMENT LEFT ANKLE, LEFT ANKLE ORIF (Left)  SURGEON:  Surgeon(s) and Role:    * Yolonda Kidaogers, Jason Patrick, MD - Primary  PHYSICIAN ASSISTANT:   ASSISTANTS: none   ANESTHESIA:   general  EBL:  Total I/O In: 1900 [I.V.:1850; IV Piggyback:50] Out: 225 [Urine:200; Blood:25]  BLOOD ADMINISTERED:none  DRAINS: none   LOCAL MEDICATIONS USED:  NONE  SPECIMEN:  No Specimen  DISPOSITION OF SPECIMEN:  N/A  COUNTS:  YES  TOURNIQUET:   Total Tourniquet Time Documented: Thigh (Left) - 68 minutes Total: Thigh (Left) - 68 minutes   DICTATION: .Note written in EPIC  PLAN OF CARE: Admit to inpatient   PATIENT DISPOSITION:  PACU - hemodynamically stable.   Delay start of Pharmacological VTE agent (>24hrs) due to surgical blood loss or risk of bleeding: not applicable

## 2017-01-25 NOTE — Addendum Note (Signed)
Addendum  created 01/25/17 0355 by Dorris SinghGreen, Burnice Vassel, MD   Order sets accessed
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
## 2017-01-26 MED ORDER — LORATADINE 10 MG PO TABS
10.0000 mg | ORAL_TABLET | Freq: Every day | ORAL | Status: DC
  Administered 2017-01-26 – 2017-01-31 (×6): 10 mg via ORAL
  Filled 2017-01-26 (×6): qty 1

## 2017-01-26 MED ORDER — FLUTICASONE PROPIONATE 50 MCG/ACT NA SUSP
2.0000 | Freq: Every day | NASAL | Status: DC
  Administered 2017-01-26 – 2017-01-31 (×5): 2 via NASAL
  Filled 2017-01-26: qty 16
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
## 2017-01-27 ENCOUNTER — Encounter (HOSPITAL_COMMUNITY): Payer: Self-pay | Admitting: General Practice
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
## 2017-01-27 NOTE — Care Management Note (Signed)
Case Management Note  Patient Details  Name: [name] MRN: [id number] Date of Birth: Nov 25, 1960  Subjective/Objective:                    Action/Plan:  Patient has friend [name] who has 3 in 1 , walker and wheelchair patient can use at DC Expected Discharge Date:                  Expected Discharge Plan:  Home w Home Health Services  In-House Referral:     Discharge planning Services  CM Consult  Post Acute Care Choice:  Durable Medical Equipment, Home Health Choice offered to:  Patient  DME Arranged:    DME Agency:     HH Arranged:  PT, OT HH Agency:  [facility]  Status of Service:  Completed, signed off  If discussed at Long Length of Stay Meetings, dates discussed:    Additional Comments:  [name], Malonie Tatum Marie, RN 01/27/2017, 3:19 PM

## 2017-01-27 NOTE — Progress Notes (Signed)
   Subjective: 3 Days Post-Op Procedure(s) (LRB): IRRIGATION AND DEBRIDEMENT LEFT ANKLE, LEFT ANKLE ORIF (Left)  Pt c/o moderate to severe pain in the knee/ankle today MRI shows nondisplaced medial tibial plateau fracture with torn MCL and tibia non-displaced  PCL fracture Severe pain with trying to stand  Remains non weight bearing left lower extremity Patient reports pain as severe.  Objective:   VITALS:   Vitals:   01/26/17 2006 01/27/17 0450  BP: 124/70 132/71  Pulse: 84 85  Resp: 17 18  Temp: 99.6 F (37.6 C) 98.8 F (37.1 C)  SpO2: 99% 98%    Left knee with mild effusion and ecchymosis Left ankle in splint nv intact distally Mild edema to left foot/toes  LABS  Recent Labs  01/24/17 2033 01/24/17 2043 01/25/17 0345  HGB 13.7 13.6 12.3  HCT 40.3 40.0 37.0  WBC 6.9  --  9.3  PLT 239  --  199     Recent Labs  01/24/17 2043 01/25/17 0345  NA 142 139  K 3.2* 4.0  BUN 16 11  CREATININE 1.30* 1.08*  GLUCOSE 139* 197*     Assessment/Plan: 3 Days Post-Op Procedure(s) (LRB): IRRIGATION AND DEBRIDEMENT LEFT ANKLE, LEFT ANKLE ORIF (Left) Left ankle ORIF and nondispaced proximal posterior tibial medial plateau fx, MCL tear Continue strict non weight bearing Possible d/c tomorrow if pain control improves Continue therapy as tolerated lovenox and SCDs for DVT ppx   Yolonda KidaJason Patrick Nasreen Goedecke   01/27/2017, 1:58 PM

## 2017-01-27 NOTE — Progress Notes (Signed)
PT Cancellation Note  Patient Details Name: Kelly LawmanSharon Huynh MRN: [id number] DOB: 09-Jul-1960   Cancelled Treatment:    Reason Eval/Treat Not Completed: (P) Pain limiting ability to participate (Pt reports intolerable pain from previous mobility from bed to chair.  She reports she is just not getting relief but agreeable to attempt PT later this pm.  Will f/u.  )   Mariska Daffin Artis DelayJ Rosangelica Pevehouse 01/27/2017, 12:11 PM  Joycelyn RuaAimee Uniqua Kihn, PTA pager [phone]

## 2017-01-27 NOTE — Progress Notes (Signed)
    Durable Medical Equipment        Start     Ordered   01/27/17 [phone] [id number]  For home use only DME 3 n 1  Once     01/27/17 [id number]   01/27/17 0830  For home use only DME Walker rolling  Once    Question:  Patient needs a walker to treat with the following condition  Answer:  Open fracture of left ankle   01/27/17 0830   01/27/17 0827  For home use only DME lightweight manual wheelchair with seat cushion  Once    Comments:  Patient suffers from IRRIGATION AND DEBRIDEMENT LEFT ANKLE, LEFT ANKLE ORIF which impairs their ability to perform daily activities like  Ambulating  in the home.  A cane will not resolve  issue with performing activities of daily living. A wheelchair will allow patient to safely perform daily activities. Patient is not able to propel themselves in the home using a standard weight wheelchair due to IRRIGATION AND DEBRIDEMENT LEFT ANKLE, LEFT ANKLE ORIF  . Patient can self propel in the lightweight wheelchair.  Accessories: elevating leg rests (ELRs), wheel locks, extensions and anti-tippers. ;Wheelchair (measurements PT);Other (comment) (elevated leg rests)     01/27/17 0830     Ina HomesJaclyn Mairlyn Tegtmeyer, PT, DPT Acute Rehab Services  Pager: [phone]  .

## 2017-01-27 NOTE — Care Management Note (Signed)
Case Management Note  Patient Details  Name: Wynonia LawmanSharon Ledwith MRN: [id number] Date of Birth: 12/24/1960  Subjective/Objective:                    Action/Plan:  PT recommendations HHPT, walker and wheelchair.  Will need NCM progress note and orders for DME and HHPT signed by MD/PA  Thanks Ronny FlurryHeather Janelys Glassner RN [phone]  Expected Discharge Date:                  Expected Discharge Plan:  Home w Home Health Services  In-House Referral:     Discharge planning Services  CM Consult  Post Acute Care Choice:  Durable Medical Equipment, Home Health Choice offered to:     DME Arranged:    DME Agency:     HH Arranged:    HH Agency:     Status of Service:  In process, will continue to follow  If discussed at Long Length of Stay Meetings, dates discussed:    Additional Comments:  [name], Jericca Russett Marie, RN 01/27/2017, 8:30 AM
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
## 2017-01-27 NOTE — Progress Notes (Signed)
   Subjective: 3 Days Post-Op Procedure(s) (LRB): IRRIGATION AND DEBRIDEMENT LEFT ANKLE, LEFT ANKLE ORIF (Left)  Pt c/o moderate to severe pain in the knee/ankle today MRI shows nondisplaced medial tibial plateau fracture with torn MCL and and PCL Severe pain with trying to stand  Remains non weight bearing left lower extremity Patient reports pain as severe.  Objective:   VITALS:   Vitals:   01/26/17 2006 01/27/17 0450  BP: 124/70 132/71  Pulse: 84 85  Resp: 17 18  Temp: 99.6 F (37.6 C) 98.8 F (37.1 C)  SpO2: 99% 98%    Left knee with mild effusion and ecchymosis Left ankle in splint nv intact distally Mild edema to left foot/toes  LABS  Recent Labs  01/24/17 2033 01/24/17 2043 01/25/17 0345  HGB 13.7 13.6 12.3  HCT 40.3 40.0 37.0  WBC 6.9  --  9.3  PLT 239  --  199     Recent Labs  01/24/17 2043 01/25/17 0345  NA 142 139  K 3.2* 4.0  BUN 16 11  CREATININE 1.30* 1.08*  GLUCOSE 139* 197*     Assessment/Plan: 3 Days Post-Op Procedure(s) (LRB): IRRIGATION AND DEBRIDEMENT LEFT ANKLE, LEFT ANKLE ORIF (Left) Left ankle ORIF and left tibial plateau fracture Continue strict non weight bearing Possible d/c tomorrow if pain control improves Continue therapy as tolerated    Alphonsa OverallBrad Dixon, MPAS, PA-C  01/27/2017, 10:41 AM

## 2017-01-27 NOTE — Progress Notes (Signed)
PT Cancellation Note  Patient Details Name: [name] MRN: [id number] DOB: August 13, 1960   Cancelled Treatment:    Reason Eval/Treat Not Completed: Pain limiting ability to participate. Pt reports continuing to have intolerable pain with mobility and will be unable to mobilize or perform seated/supine therex with therapy. Will check back as time allows this afternoon.  [name], PT, DPT Acute Rehab Services  Pager: [phone]  [name] 01/27/2017, 2:11 PM

## 2017-01-27 NOTE — Progress Notes (Signed)
    Subjective: 3 Days Post-Op Procedure(s) (LRB): IRRIGATION AND DEBRIDEMENT LEFT ANKLE, LEFT ANKLE ORIF (Left) Patient reports pain as 5 on 0-10 scale.   Denies CP or SOB.  Voiding without difficulty. Positive flatus. Objective: Vital signs in last 24 hours: Temp:  [98.5 F (36.9 C)-99.6 F (37.6 C)] 98.8 F (37.1 C) (09/03 0450) Pulse Rate:  [84-89] 85 (09/03 0450) Resp:  [17-18] 18 (09/03 0450) BP: (124-138)/(70-79) 132/71 (09/03 0450) SpO2:  [98 %-99 %] 98 % (09/03 0450)  Intake/Output from previous day: 09/02 0701 - 09/03 0700 In: 720 [P.O.:720] Out: 950 [Urine:950] Intake/Output this shift: No intake/output data recorded.  Labs:  Recent Labs  01/24/17 2033 01/24/17 2043 01/25/17 0345  HGB 13.7 13.6 12.3    Recent Labs  01/24/17 2033 01/24/17 2043 01/25/17 0345  WBC 6.9  --  9.3  RBC 4.96  --  4.53  HCT 40.3 40.0 37.0  PLT 239  --  199    Recent Labs  01/24/17 2043 01/25/17 0345  NA 142 139  K 3.2* 4.0  CL 106 107  CO2  --  23  BUN 16 11  CREATININE 1.30* 1.08*  GLUCOSE 139* 197*  CALCIUM  --  8.3*   No results for input(s): LABPT, INR in the last 72 hours.  Physical Exam: Neurologically intact Intact pulses distally Compartment soft splint in good condition  Assessment/Plan: 3 Days Post-Op Procedure(s) (LRB): IRRIGATION AND DEBRIDEMENT LEFT ANKLE, LEFT ANKLE ORIF (Left) Advance diet Up with therapy NWB LLE lovenox and SCDs for DVT ppx  MRI left knee done, complete proximal MCL tear with non-displaced posterior tibial eminence fracture at PCL attachment and posterior horn medial meniscus.  Will plan for non op of these injuries.  Will need different bracing once SL splint is off, which will make bracing difficult at this time.  [name] f

## 2017-01-28 NOTE — Progress Notes (Signed)
   Subjective: 4 Days Post-Op Procedure(s) (LRB): IRRIGATION AND DEBRIDEMENT LEFT ANKLE, LEFT ANKLE ORIF (Left)  Pt c/o moderate  pain in the knee/ankle today Severe pain with trying to stand  Remains non weight bearing left lower extremity Patient reports pain as severe.  Objective:   VITALS:   Vitals:   01/27/17 2041 01/28/17 0452  BP: 124/71 127/71  Pulse: 86 89  Resp: 17 20  Temp: 99.3 F (37.4 C) 99.6 F (37.6 C)  SpO2: 99% 97%    Left knee with mild effusion and ecchymosis Left ankle in splint +SILT dp/sp Wiggles toes Skin WWP with cap refill < 2 s Mild edema to left foot/toes  LABS No results for input(s): HGB, HCT, WBC, PLT in the last 72 hours.  No results for input(s): NA, K, BUN, CREATININE, GLUCOSE in the last 72 hours.   Assessment/Plan: 4 Days Post-Op Procedure(s) (LRB): IRRIGATION AND DEBRIDEMENT LEFT ANKLE, LEFT ANKLE ORIF (Left) Left ankle ORIF and nondispaced proximal posterior tibial medial plateau fx, MCL tear Continue strict non weight bearing, will need transition to knee brace after splint off ankle and for when WB begins. Still having issues with pain when out of bed, will watch today and tonight and hopefully dc tomorrow Continue therapy as tolerated lovenox and SCDs for DVT ppx   Yolonda KidaJason Patrick Rogers   01/28/2017, 7:27 AM

## 2017-01-28 NOTE — Progress Notes (Signed)
Orthopedic Tech Progress Note Patient Details:  Kelly LawmanSharon Huynh May 23, 1961 [id number]  Ortho Devices Type of Ortho Device: Knee Immobilizer Ortho Device/Splint Interventions: Application   Saul FordyceJennifer C Masa Lubin 01/28/2017, 9:37 AM
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
## 2017-01-30 MED ORDER — ENOXAPARIN (LOVENOX) PATIENT EDUCATION KIT
PACK | Freq: Once | Status: AC
  Administered 2017-01-30: 17:00:00
  Filled 2017-01-30: qty 1

## 2017-01-30 MED ORDER — METHOCARBAMOL 500 MG PO TABS: 500.0000 mg | ORAL_TABLET | Freq: Four times a day (QID) | ORAL | 1 refills | Status: DC | PRN
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
## 2017-01-30 NOTE — Progress Notes (Signed)
Patient's discharge completed, IV removed, lovenox teaching kit, discharge instructions, and prescriptions given to patient. Home health not yet set up. Called ED case manager who came up and spoke with patient. Patient stated worker's comp case manager told her to stay if everything was not set up and she did not have her equipment. Patient needs walker, 3-in-1, and wheelchair that was not in room. Patient's case manager came to her room around 1700, but had to leave. Stated she needs DME equipment orders faxed to her. Danley Danker office: [phone] fax: [phone]. Report given to night shift RN, Louie Casa.

## 2017-01-30 NOTE — Discharge Instructions (Signed)
-   no weightbearing to the left lower extremity. - Elevate the left lower extremity at all times when able. - When out of bed where the knee immobilizer to the left knee. - For prevention of blood clots administer one injection with Lovenox 40 mg once daily for 1 month. - For pain take Tylenol 1000 mg every 6 hours, for increased pain above that use oxycodone 5-10 mg every 4-6 hours as needed. Maintain ice to the ankle and knee as needed. - Keep your left ankle splint clean and dry. - Return to the office to see Dr. Aundria Rudogers in 2 weeks.

## 2017-01-30 NOTE — Care Management (Signed)
ED CM received call from [facility] on 5N concerning patient being discharged this evening and needing DME equipment and [place] arranged.  Recommendation by PT is for a w/c. CM met with patient met with patient to explained that most DME companies are closed so if would not be likely that she would receive DME tonight. Patient verbalized understanding and is agreeable about waiting until the am. Vicente Males RN updated.  CM will leave handoff for Ricki Miller CM on 5W to follow up in the am.
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
## 2017-01-31 NOTE — Care Management (Addendum)
Case manager notified today that patient is under worker's comp, this was first mention of WC since patient was admitted. CM has faxed orders to Eulah PontMichelle Dominguez at [phone]. Patient says she notified someone. CM is working with [facility] Liaison to secure DME for patient's discharge  12:01pm - Case manager spoke with Eulah PontMichelle Dominguez, who states she was only assigned patient on 01/30/17. CM has faxed orders and is waiting for return call concerning DME.
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
## 2017-01-31 NOTE — Progress Notes (Signed)
Reviewed discharge paperwork with patient and prescriptions. All equipment sent home with patient, w/c, walker and 3-in-1. Patient and son have full understanding of medications and discharge paperwork
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
## 2017-02-03 ENCOUNTER — Encounter: Payer: Self-pay | Admitting: Family Medicine
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
## 2017-02-20 ENCOUNTER — Encounter (HOSPITAL_COMMUNITY): Payer: Self-pay

## 2017-02-20 ENCOUNTER — Encounter (HOSPITAL_COMMUNITY)
Admission: RE | Admit: 2017-02-20 | Discharge: 2017-02-20 | Disposition: A | Discharge status: Home / Self Care | Payer: Worker's Compensation | Source: Ambulatory Visit | Attending: Orthopedic Surgery | Admitting: Orthopedic Surgery

## 2017-02-20 DIAGNOSIS — Z79899 Other long term (current) drug therapy: Secondary | ICD-10-CM | POA: Diagnosis not present

## 2017-02-20 DIAGNOSIS — Z8249 Family history of ischemic heart disease and other diseases of the circulatory system: Secondary | ICD-10-CM | POA: Diagnosis not present

## 2017-02-20 DIAGNOSIS — Z7901 Long term (current) use of anticoagulants: Secondary | ICD-10-CM | POA: Diagnosis not present

## 2017-02-20 DIAGNOSIS — S93432A Sprain of tibiofibular ligament of left ankle, initial encounter: Secondary | ICD-10-CM | POA: Diagnosis not present

## 2017-02-20 DIAGNOSIS — Z6830 Body mass index (BMI) 30.0-30.9, adult: Secondary | ICD-10-CM | POA: Diagnosis not present

## 2017-02-20 DIAGNOSIS — Z886 Allergy status to analgesic agent status: Secondary | ICD-10-CM | POA: Diagnosis not present

## 2017-02-20 DIAGNOSIS — Z880 Allergy status to penicillin: Secondary | ICD-10-CM | POA: Diagnosis not present

## 2017-02-20 DIAGNOSIS — Z885 Allergy status to narcotic agent status: Secondary | ICD-10-CM | POA: Diagnosis not present

## 2017-02-20 DIAGNOSIS — Z87891 Personal history of nicotine dependence: Secondary | ICD-10-CM | POA: Diagnosis not present

## 2017-02-20 DIAGNOSIS — E669 Obesity, unspecified: Secondary | ICD-10-CM | POA: Diagnosis not present

## 2017-02-20 HISTORY — DX: Sprain of tibiofibular ligament of unspecified ankle, initial encounter: S93.439A

## 2017-02-20 LAB — BASIC METABOLIC PANEL
ANION GAP: 9 (ref 5–15)
BUN: 13 mg/dL (ref 6–20)
CALCIUM: 9.3 mg/dL (ref 8.9–10.3)
CHLORIDE: 103 mmol/L (ref 101–111)
CO2: 25 mmol/L (ref 22–32)
Creatinine, Ser: 1.43 mg/dL — ABNORMAL HIGH (ref 0.44–1.00)
GFR calc non Af Amer: 40 mL/min — ABNORMAL LOW (ref >60)
GFR, EST AFRICAN AMERICAN: 46 mL/min — AB (ref >60)
GLUCOSE: 95 mg/dL (ref 65–99)
POTASSIUM: 3.7 mmol/L (ref 3.5–5.1)
Sodium: 137 mmol/L (ref 135–145)

## 2017-02-20 LAB — SURGICAL PCR SCREEN
MRSA, PCR: NEGATIVE
Staphylococcus aureus: NEGATIVE

## 2017-02-20 LAB — CBC
HEMATOCRIT: 38.5 % (ref 36.0–46.0)
Hemoglobin: 12.6 g/dL (ref 12.0–15.0)
MCH: 27.2 pg (ref 26.0–34.0)
MCHC: 32.7 g/dL (ref 30.0–36.0)
MCV: 83.2 fL (ref 78.0–100.0)
Platelets: 279 10*3/uL (ref 150–400)
RBC: 4.63 MIL/uL (ref 3.87–5.11)
RDW: 13.2 % (ref 11.5–15.5)
WBC: 5.2 10*3/uL (ref 4.0–10.5)

## 2017-02-20 NOTE — Progress Notes (Signed)
PCP - Dr. Enos Fling  Cardiologist - Denies  Chest x-ray - 01/24/17  EKG - Denies  Stress Test - Denies  ECHO - Denies  Cardiac Cath - Denies  Sleep Study - No CPAP - None Message left for Sherri at Dr. William Hamburger office regarding the Lovenox regimen. Pt also advised to call the office for clarification.  Pt denies having chest pain, sob, or fever at this time. All instructions explained to the pt, with a verbal understanding of the material. Pt agrees to go over the instructions while at home for a better understanding. The opportunity to ask questions was provided.

## 2017-02-20 NOTE — Pre-Procedure Instructions (Signed)
[name]  02/20/2017      [id number] - [place], [place] - [address] [place] [address] Phone: [phone] Fax: [phone]    Your procedure is scheduled on Friday, February 21, 2017  Report to [facility] Admitting Entrance "A" at [employer] this number if you have problems the morning of surgery:  [phone]   Remember:  Do not eat food or drink liquids after midnight.  Take these medicines the morning of surgery with A SIP OF WATER: If needed OxyCODONE (OXY IR/ROXICODONE) for pain, Naphazoline-Pheniramine (OPCON-A) for eye allergies, Mometasone (NASONEX) for nasal allergies, and Albuterol (PROAIR HFA) inhaler for cough or wheezing (bring with you the day of surgery).  As of today, stop taking all Aspirins, Vitamins, Fish oils, and Herbal medications. Also stop all NSAIDS i.e. Advil, Motrin, Aleve, Anaprox, Naproxen, BC and Goody Powders.   Do not wear jewelry, make-up or nail polish.  Do not wear lotions, powders, or perfumes, or deodorant.  Do not shave 48 hours prior to surgery.    Do not bring valuables to the hospital.  [facility] is not responsible for any belongings or valuables.  Contacts, dentures or bridgework may not be worn into surgery.  Leave your suitcase in the car.  After surgery it may be brought to your room.  For patients admitted to the hospital, discharge time will be determined by your treatment team.  Patients discharged the day of surgery will not be allowed to drive home.   Special instructions:  [facility]- Preparing For Surgery  Before surgery, you can play an important role. Because skin is not sterile, your skin needs to be as free of germs as possible. You can reduce the number of germs on your skin by washing with CHG (chlorahexidine gluconate) Soap before surgery.  CHG is an antiseptic cleaner which kills germs and bonds with the skin to continue killing germs even after  washing.  Please do not use if you have an allergy to CHG or antibacterial soaps. If your skin becomes reddened/irritated stop using the CHG.  Do not shave (including legs and underarms) for at least 48 hours prior to first CHG shower. It is OK to shave your face.  Please follow these instructions carefully.   1. Shower the NIGHT BEFORE SURGERY and the MORNING OF SURGERY with CHG.   2. If you chose to wash your hair, wash your hair first as usual with your normal shampoo.  3. After you shampoo, rinse your hair and body thoroughly to remove the shampoo.  4. Use CHG as you would any other liquid soap. You can apply CHG directly to the skin and wash gently with a scrungie or a clean washcloth.   5. Apply the CHG Soap to your body ONLY FROM THE NECK DOWN.  Do not use on open wounds or open sores. Avoid contact with your eyes, ears, mouth and genitals (private parts). Wash genitals (private parts) with your normal soap.  6. Wash thoroughly, paying special attention to the area where your surgery will be performed.  7. Thoroughly rinse your body with warm water from the neck down.  8. DO NOT shower/wash with your normal soap after using and rinsing off the CHG Soap.  9. Pat yourself dry with a CLEAN TOWEL.   10. Wear CLEAN PAJAMAS   11. Place CLEAN SHEETS on your bed the night of your first shower and DO NOT SLEEP WITH  PETS.  Day of Surgery: Do not apply any deodorants/lotions. Please wear clean clothes to the hospital/surgery center.    Please read over the following fact sheets that you were given. Pain Booklet, Coughing and Deep Breathing, MRSA Information and Surgical Site Infection Prevention

## 2017-02-21 ENCOUNTER — Encounter (HOSPITAL_COMMUNITY)
Admission: RE | Disposition: A | Discharge status: Home / Self Care | Payer: Self-pay | Source: Ambulatory Visit | Attending: Orthopedic Surgery

## 2017-02-21 ENCOUNTER — Ambulatory Visit (HOSPITAL_COMMUNITY): Payer: Worker's Compensation | Admitting: Emergency Medicine

## 2017-02-21 ENCOUNTER — Encounter (HOSPITAL_COMMUNITY): Payer: Self-pay | Admitting: *Deleted

## 2017-02-21 ENCOUNTER — Ambulatory Visit (HOSPITAL_COMMUNITY)
Admission: RE | Admit: 2017-02-21 | Discharge: 2017-02-21 | Disposition: A | Discharge status: Home / Self Care | Payer: Worker's Compensation | Source: Ambulatory Visit | Attending: Orthopedic Surgery | Admitting: Orthopedic Surgery

## 2017-02-21 ENCOUNTER — Ambulatory Visit (HOSPITAL_COMMUNITY): Payer: Worker's Compensation | Admitting: Certified Registered Nurse Anesthetist

## 2017-02-21 ENCOUNTER — Ambulatory Visit (HOSPITAL_COMMUNITY): Payer: Worker's Compensation

## 2017-02-21 DIAGNOSIS — Z419 Encounter for procedure for purposes other than remedying health state, unspecified: Secondary | ICD-10-CM

## 2017-02-21 DIAGNOSIS — Z7901 Long term (current) use of anticoagulants: Secondary | ICD-10-CM | POA: Insufficient documentation

## 2017-02-21 DIAGNOSIS — Z6830 Body mass index (BMI) 30.0-30.9, adult: Secondary | ICD-10-CM | POA: Diagnosis not present

## 2017-02-21 DIAGNOSIS — E669 Obesity, unspecified: Secondary | ICD-10-CM | POA: Diagnosis not present

## 2017-02-21 DIAGNOSIS — S93432A Sprain of tibiofibular ligament of left ankle, initial encounter: Secondary | ICD-10-CM | POA: Insufficient documentation

## 2017-02-21 DIAGNOSIS — Z79899 Other long term (current) drug therapy: Secondary | ICD-10-CM | POA: Insufficient documentation

## 2017-02-21 DIAGNOSIS — Z885 Allergy status to narcotic agent status: Secondary | ICD-10-CM | POA: Insufficient documentation

## 2017-02-21 DIAGNOSIS — S93432D Sprain of tibiofibular ligament of left ankle, subsequent encounter: Secondary | ICD-10-CM

## 2017-02-21 DIAGNOSIS — Z886 Allergy status to analgesic agent status: Secondary | ICD-10-CM | POA: Insufficient documentation

## 2017-02-21 DIAGNOSIS — Z87891 Personal history of nicotine dependence: Secondary | ICD-10-CM | POA: Diagnosis not present

## 2017-02-21 DIAGNOSIS — Z8249 Family history of ischemic heart disease and other diseases of the circulatory system: Secondary | ICD-10-CM | POA: Insufficient documentation

## 2017-02-21 DIAGNOSIS — Z880 Allergy status to penicillin: Secondary | ICD-10-CM | POA: Insufficient documentation

## 2017-02-21 HISTORY — PX: ORIF ANKLE FRACTURE: SHX5408

## 2017-02-21 SURGERY — OPEN REDUCTION INTERNAL FIXATION (ORIF) ANKLE FRACTURE
Anesthesia: General | Site: Ankle | Laterality: Left

## 2017-02-21 MED ORDER — CLINDAMYCIN PHOSPHATE 900 MG/50ML IV SOLN
900.0000 mg | INTRAVENOUS | Status: AC
  Administered 2017-02-21: 900 mg via INTRAVENOUS

## 2017-02-21 MED ORDER — LIDOCAINE HCL (CARDIAC) 20 MG/ML IV SOLN
INTRAVENOUS | Status: DC | PRN
  Administered 2017-02-21: 100 mg via INTRAVENOUS

## 2017-02-21 MED ORDER — ENOXAPARIN SODIUM 40 MG/0.4ML ~~LOC~~ SOLN: 40.0000 mg | SUBCUTANEOUS | 0 refills | Status: DC

## 2017-02-21 MED ORDER — FENTANYL CITRATE (PF) 250 MCG/5ML IJ SOLN
INTRAMUSCULAR | Status: AC
  Filled 2017-02-21: qty 5

## 2017-02-21 MED ORDER — OXYCODONE HCL 5 MG PO TABS: 5.0000 mg | ORAL_TABLET | ORAL | 0 refills | Status: AC | PRN

## 2017-02-21 MED ORDER — LACTATED RINGERS IV SOLN
INTRAVENOUS | Status: DC | PRN
  Administered 2017-02-21 (×3): via INTRAVENOUS

## 2017-02-21 MED ORDER — PROPOFOL 10 MG/ML IV BOLUS
INTRAVENOUS | Status: AC
  Filled 2017-02-21: qty 20

## 2017-02-21 MED ORDER — FENTANYL CITRATE (PF) 100 MCG/2ML IJ SOLN
50.0000 ug | Freq: Once | INTRAMUSCULAR | Status: AC
  Administered 2017-02-21: 50 ug via INTRAVENOUS
  Filled 2017-02-21: qty 1

## 2017-02-21 MED ORDER — BUPIVACAINE-EPINEPHRINE (PF) 0.5% -1:200000 IJ SOLN
INTRAMUSCULAR | Status: DC | PRN
  Administered 2017-02-21: 20 mL via PERINEURAL
  Administered 2017-02-21: 25 mL via PERINEURAL

## 2017-02-21 MED ORDER — MIDAZOLAM HCL 2 MG/2ML IJ SOLN
INTRAMUSCULAR | Status: AC
  Filled 2017-02-21: qty 2

## 2017-02-21 MED ORDER — 0.9 % SODIUM CHLORIDE (POUR BTL) OPTIME
TOPICAL | Status: DC | PRN
  Administered 2017-02-21: 1000 mL

## 2017-02-21 MED ORDER — CLINDAMYCIN PHOSPHATE 900 MG/50ML IV SOLN
INTRAVENOUS | Status: AC
  Filled 2017-02-21: qty 50

## 2017-02-21 MED ORDER — ONDANSETRON 4 MG PO TBDP: 4.0000 mg | ORAL_TABLET | Freq: Three times a day (TID) | ORAL | 0 refills | Status: DC | PRN

## 2017-02-21 MED ORDER — MIDAZOLAM HCL 5 MG/5ML IJ SOLN
INTRAMUSCULAR | Status: DC | PRN
  Administered 2017-02-21: 2 mg via INTRAVENOUS

## 2017-02-21 MED ORDER — LACTATED RINGERS IV SOLN
INTRAVENOUS | Status: DC
  Administered 2017-02-21: 12:00:00 via INTRAVENOUS

## 2017-02-21 MED ORDER — MIDAZOLAM HCL 2 MG/2ML IJ SOLN
2.0000 mg | Freq: Once | INTRAMUSCULAR | Status: AC
  Administered 2017-02-21: 2 mg via INTRAVENOUS
  Filled 2017-02-21: qty 2

## 2017-02-21 MED ORDER — PROPOFOL 10 MG/ML IV BOLUS
INTRAVENOUS | Status: DC | PRN
  Administered 2017-02-21: 150 mg via INTRAVENOUS

## 2017-02-21 MED ORDER — ONDANSETRON HCL 4 MG/2ML IJ SOLN
INTRAMUSCULAR | Status: DC | PRN
  Administered 2017-02-21: 4 mg via INTRAVENOUS

## 2017-02-21 MED ORDER — MIDAZOLAM HCL 2 MG/2ML IJ SOLN
INTRAMUSCULAR | Status: AC
  Administered 2017-02-21: 2 mg via INTRAVENOUS
  Filled 2017-02-21: qty 2

## 2017-02-21 MED ORDER — FENTANYL CITRATE (PF) 100 MCG/2ML IJ SOLN
INTRAMUSCULAR | Status: DC | PRN
  Administered 2017-02-21: 100 ug via INTRAVENOUS

## 2017-02-21 MED ORDER — CHLORHEXIDINE GLUCONATE 4 % EX LIQD: 60.0000 mL | Freq: Once | CUTANEOUS | Status: DC

## 2017-02-21 MED ORDER — PHENYLEPHRINE HCL 10 MG/ML IJ SOLN
INTRAMUSCULAR | Status: DC | PRN
  Administered 2017-02-21 (×3): 80 ug via INTRAVENOUS

## 2017-02-21 MED ORDER — FENTANYL CITRATE (PF) 100 MCG/2ML IJ SOLN
INTRAMUSCULAR | Status: AC
  Administered 2017-02-21: 50 ug via INTRAVENOUS
  Filled 2017-02-21: qty 2

## 2017-02-21 SURGICAL SUPPLY — 59 items
BANDAGE ACE 4X5 VEL STRL LF (GAUZE/BANDAGES/DRESSINGS) ×2 IMPLANT
BANDAGE ACE 6X5 VEL STRL LF (GAUZE/BANDAGES/DRESSINGS) ×2 IMPLANT
BANDAGE ESMARK 6X9 LF (GAUZE/BANDAGES/DRESSINGS) IMPLANT
BIT DRILL 2.5 X LONG (BIT) ×1
BIT DRILL X LONG 2.5 (BIT) IMPLANT
BNDG CMPR 9X6 STRL LF SNTH (GAUZE/BANDAGES/DRESSINGS) ×1
BNDG COHESIVE 4X5 TAN STRL (GAUZE/BANDAGES/DRESSINGS) ×3 IMPLANT
BNDG ESMARK 6X9 LF (GAUZE/BANDAGES/DRESSINGS) ×3
CANISTER SUCT 3000ML PPV (MISCELLANEOUS) ×1 IMPLANT
CLOSURE STERI-STRIP 1/4X4 (GAUZE/BANDAGES/DRESSINGS) ×2 IMPLANT
COVER SURGICAL LIGHT HANDLE (MISCELLANEOUS) ×3 IMPLANT
CUFF TOURNIQUET SINGLE 34IN LL (TOURNIQUET CUFF) IMPLANT
DRAPE C-ARM 42X72 X-RAY (DRAPES) ×3 IMPLANT
DRAPE C-ARMOR (DRAPES) ×3 IMPLANT
DRAPE U-SHAPE 47X51 STRL (DRAPES) ×3 IMPLANT
DRILL BIT X LONG 2.5 (BIT) ×3
DRSG ADAPTIC 3X8 NADH LF (GAUZE/BANDAGES/DRESSINGS) ×1 IMPLANT
DRSG PAD ABDOMINAL 8X10 ST (GAUZE/BANDAGES/DRESSINGS) ×1 IMPLANT
DURAPREP 26ML APPLICATOR (WOUND CARE) ×3 IMPLANT
ELECT REM PT RETURN 9FT ADLT (ELECTROSURGICAL) ×3
ELECTRODE REM PT RTRN 9FT ADLT (ELECTROSURGICAL) ×1 IMPLANT
GAUZE SPONGE 4X4 12PLY STRL (GAUZE/BANDAGES/DRESSINGS) ×1 IMPLANT
GAUZE SPONGE 4X4 12PLY STRL LF (GAUZE/BANDAGES/DRESSINGS) ×2 IMPLANT
GLOVE BIO SURGEON STRL SZ7.5 (GLOVE) ×3 IMPLANT
GLOVE BIOGEL PI IND STRL 8 (GLOVE) ×1 IMPLANT
GLOVE BIOGEL PI INDICATOR 8 (GLOVE) ×2
GOWN STRL REUS W/ TWL LRG LVL3 (GOWN DISPOSABLE) ×2 IMPLANT
GOWN STRL REUS W/ TWL XL LVL3 (GOWN DISPOSABLE) ×1 IMPLANT
GOWN STRL REUS W/TWL LRG LVL3 (GOWN DISPOSABLE) ×6
GOWN STRL REUS W/TWL XL LVL3 (GOWN DISPOSABLE) ×3
IMPL TIGHTROP W/DRV K-LESS (Anchor) IMPLANT
IMPLANT TIGHTROPE W/DRV K-LESS (Anchor) ×3 IMPLANT
KIT BASIN OR (CUSTOM PROCEDURE TRAY) ×3 IMPLANT
KIT ROOM TURNOVER OR (KITS) ×3 IMPLANT
NDL HYPO 25GX1X1/2 BEV (NEEDLE) IMPLANT
NEEDLE HYPO 25GX1X1/2 BEV (NEEDLE) IMPLANT
NS IRRIG 1000ML POUR BTL (IV SOLUTION) ×3 IMPLANT
PACK ORTHO EXTREMITY (CUSTOM PROCEDURE TRAY) ×3 IMPLANT
PAD ABD 8X10 STRL (GAUZE/BANDAGES/DRESSINGS) ×2 IMPLANT
PAD ARMBOARD 7.5X6 YLW CONV (MISCELLANEOUS) ×6 IMPLANT
PAD CAST 4YDX4 CTTN HI CHSV (CAST SUPPLIES) ×2 IMPLANT
PADDING CAST COTTON 4X4 STRL (CAST SUPPLIES) ×3
PADDING CAST COTTON 6X4 STRL (CAST SUPPLIES) ×3 IMPLANT
SCREW CANC FT 4.0X20 (Screw) ×2 IMPLANT
SCREW CORTEX 3.5 55MM (Screw) ×2 IMPLANT
SPLINT FIBERGLASS 4X30 (CAST SUPPLIES) ×2 IMPLANT
SPONGE LAP 18X18 X RAY DECT (DISPOSABLE) IMPLANT
SUCTION FRAZIER HANDLE 10FR (MISCELLANEOUS) ×2
SUCTION TUBE FRAZIER 10FR DISP (MISCELLANEOUS) ×1 IMPLANT
SUT ETHILON 3 0 PS 1 (SUTURE) ×1 IMPLANT
SUT VIC AB 0 CT1 27 (SUTURE) ×3
SUT VIC AB 0 CT1 27XBRD ANBCTR (SUTURE) IMPLANT
SUT VIC AB 2-0 CT1 27 (SUTURE) ×3
SUT VIC AB 2-0 CT1 TAPERPNT 27 (SUTURE) ×1 IMPLANT
SYR CONTROL 10ML LL (SYRINGE) IMPLANT
TOWEL OR 17X24 6PK STRL BLUE (TOWEL DISPOSABLE) ×1 IMPLANT
TOWEL OR 17X26 10 PK STRL BLUE (TOWEL DISPOSABLE) ×2 IMPLANT
TUBE CONNECTING 12'X1/4 (SUCTIONS) ×1
TUBE CONNECTING 12X1/4 (SUCTIONS) ×2 IMPLANT

## 2017-02-21 NOTE — Brief Op Note (Signed)
02/21/2017  3:52 PM  PATIENT:  [name]  56 y.o. female  PRE-OPERATIVE DIAGNOSIS:  Left ankle syndesmosis discuption  POST-OPERATIVE DIAGNOSIS:  Left ankle syndesmosis discuption  PROCEDURE:  Procedure(s) with comments: Left ankle hardware removal, syndesmosis internal fixation (Left) - 75 mins  SURGEON:  Surgeon(s) and Role:    * Aundria Rud, Noah Delaine, MD - Primary  PHYSICIAN ASSISTANT:   ASSISTANTS: none   ANESTHESIA:   regional and general  EBL:  Total I/O In: 1000 [I.V.:1000] Out: -   BLOOD ADMINISTERED:none  DRAINS: none   LOCAL MEDICATIONS USED:  NONE  SPECIMEN:  No Specimen  DISPOSITION OF SPECIMEN:  N/A  COUNTS:  YES  TOURNIQUET:  * Missing tourniquet times found for documented tourniquets in log:  [id number] *  DICTATION: .Note written in EPIC  PLAN OF CARE: Discharge to home after PACU  PATIENT DISPOSITION:  PACU - hemodynamically stable.   Delay start of Pharmacological VTE agent (>24hrs) due to surgical blood loss or risk of bleeding: not applicable

## 2017-02-21 NOTE — Anesthesia Procedure Notes (Signed)
Anesthesia Regional Block: Popliteal block   Pre-Anesthetic Checklist: ,, timeout performed, Correct Patient, Correct Site, Correct Laterality, Correct Procedure, Correct Position, site marked, Risks and benefits discussed,  Surgical consent,  Pre-op evaluation,  At surgeon's request and post-op pain management  Laterality: Left  Prep: chloraprep       Needles:  Injection technique: Single-shot  Needle Type: Echogenic Needle     Needle Length: 9cm  Needle Gauge: 21     Additional Needles:   Narrative:  Start time: 02/21/2017 1:24 PM End time: 02/21/2017 1:29 PM Injection made incrementally with aspirations every 5 mL.  Performed by: Personally  Anesthesiologist: Leslye Peer E  Additional Notes: No pain on injection. No increased resistance to injection. Injection made in 5cc increments. Good needle visualization. Patient tolerated the procedure well.

## 2017-02-21 NOTE — Discharge Instructions (Signed)
Ortho instructions:  No weight bearing to the left leg -elevate with "toes above nose" to the left leg - for prevention of blood clots use lovenox once daily for 4 more weeks - do not get splint wet - return to see Dr. Aundria Rud in 2 weeks for a wound check   General Anesthesia, Adult, Care After These instructions provide you with information about caring for yourself after your procedure. Your health care provider may also give you more specific instructions. Your treatment has been planned according to current medical practices, but problems sometimes occur. Call your health care provider if you have any problems or questions after your procedure. What can I expect after the procedure? After the procedure, it is common to have:  Vomiting.  A sore throat.  Mental slowness.  It is common to feel:  Nauseous.  Cold or shivery.  Sleepy.  Tired.  Sore or achy, even in parts of your body where you did not have surgery.  Follow these instructions at home: For at least 24 hours after the procedure:  Do not: ? Participate in activities where you could fall or become injured. ? Drive. ? Use heavy machinery. ? Drink alcohol. ? Take sleeping pills or medicines that cause drowsiness. ? Make important decisions or sign legal documents. ? Take care of children on your own.  Rest. Eating and drinking  If you vomit, drink water, juice, or soup when you can drink without vomiting.  Drink enough fluid to keep your urine clear or pale yellow.  Make sure you have little or no nausea before eating solid foods.  Follow the diet recommended by your health care provider. General instructions  Have a responsible adult stay with you until you are awake and alert.  Return to your normal activities as told by your health care provider. Ask your health care provider what activities are safe for you.  Take over-the-counter and prescription medicines only as told by your health care  provider.  If you smoke, do not smoke without supervision.  Keep all follow-up visits as told by your health care provider. This is important. Contact a health care provider if:  You continue to have nausea or vomiting at home, and medicines are not helpful.  You cannot drink fluids or start eating again.  You cannot urinate after 8-12 hours.  You develop a skin rash.  You have fever.  You have increasing redness at the site of your procedure. Get help right away if:  You have difficulty breathing.  You have chest pain.  You have unexpected bleeding.  You feel that you are having a life-threatening or urgent problem. This information is not intended to replace advice given to you by your health care provider. Make sure you discuss any questions you have with your health care provider. Document Released: 08/19/2000 Document Revised: 10/16/2015 Document Reviewed: 04/27/2015 Elsevier Interactive Patient Education  [employer].

## 2017-02-21 NOTE — Anesthesia Preprocedure Evaluation (Addendum)
Anesthesia Evaluation  Patient identified by MRN, date of birth, ID band Patient awake    Reviewed: Allergy & Precautions, NPO status , Patient's Chart, lab work & pertinent test results  Airway Mallampati: II  TM Distance: >3 FB Neck ROM: Full    Dental  (+) Teeth Intact, Dental Advisory Given   Pulmonary former smoker,    Pulmonary exam normal breath sounds clear to auscultation       Cardiovascular negative cardio ROS Normal cardiovascular exam Rhythm:Regular Rate:Normal     Neuro/Psych negative neurological ROS     GI/Hepatic negative GI ROS, Neg liver ROS,   Endo/Other  Obesity  Renal/GU negative Renal ROS     Musculoskeletal   Abdominal   Peds  Hematology negative hematology ROS (+)   Anesthesia Other Findings   Reproductive/Obstetrics                             Anesthesia Physical  Anesthesia Plan  ASA: II and emergent  Anesthesia Plan: General   Post-op Pain Management:  Regional for Post-op pain   Induction: Intravenous  PONV Risk Score and Plan: 4 or greater and Ondansetron, Dexamethasone, Midazolam, Scopolamine patch - Pre-op and Treatment may vary due to age or medical condition  Airway Management Planned: LMA  Additional Equipment: None  Intra-op Plan:   Post-operative Plan: Extubation in OR  Informed Consent: I have reviewed the patients History and Physical, chart, labs and discussed the procedure including the risks, benefits and alternatives for the proposed anesthesia with the patient or authorized representative who has indicated his/her understanding and acceptance.   Dental advisory given  Plan Discussed with: CRNA  Anesthesia Plan Comments:         Anesthesia Quick Evaluation

## 2017-02-21 NOTE — Anesthesia Procedure Notes (Signed)
Procedure Name: LMA Insertion Date/Time: 02/21/2017 2:51 PM Performed by: Gavin Pound, Dyer Klug J Pre-anesthesia Checklist: Patient identified, Emergency Drugs available, Suction available, Patient being monitored and Timeout performed Patient Re-evaluated:Patient Re-evaluated prior to induction Oxygen Delivery Method: Circle system utilized Preoxygenation: Pre-oxygenation with 100% oxygen Induction Type: IV induction Ventilation: Mask ventilation without difficulty LMA: LMA inserted LMA Size: 4.0 Number of attempts: 1 Placement Confirmation: positive ETCO2 and breath sounds checked- equal and bilateral Tube secured with: Tape Dental Injury: Teeth and Oropharynx as per pre-operative assessment

## 2017-02-21 NOTE — Anesthesia Procedure Notes (Signed)
Anesthesia Regional Block: Adductor canal block   Pre-Anesthetic Checklist: ,, timeout performed, Correct Patient, Correct Site, Correct Laterality, Correct Procedure, Correct Position, site marked, Risks and benefits discussed,  Surgical consent,  Pre-op evaluation,  At surgeon's request and post-op pain management  Laterality: Left  Prep: chloraprep       Needles:  Injection technique: Single-shot  Needle Type: Echogenic Needle     Needle Length: 9cm  Needle Gauge: 21     Additional Needles:   Narrative:  Start time: 02/21/2017 1:31 PM End time: 02/21/2017 1:34 PM Injection made incrementally with aspirations every 5 mL.  Performed by: Personally  Anesthesiologist: Leslye Peer E  Additional Notes: No pain on injection. No increased resistance to injection. Injection made in 5cc increments. Good needle visualization. Patient tolerated the procedure well.

## 2017-02-21 NOTE — Transfer of Care (Signed)
Immediate Anesthesia Transfer of Care Note  Patient: [name]  Procedure(s) Performed: Procedure(s) with comments: Left ankle hardware removal, syndesmosis internal fixation (Left) - 75 mins  Patient Location: PACU  Anesthesia Type:General  Level of Consciousness: awake  Airway & Oxygen Therapy: Patient Spontanous Breathing and Patient connected to nasal cannula oxygen  Post-op Assessment: Report given to RN and Post -op Vital signs reviewed and stable  Post vital signs: Reviewed and stable  Last Vitals:  Vitals:   02/21/17 1340 02/21/17 1600  BP: 126/72 123/77  Pulse: 94 87  Resp: 18 (!) 26  Temp:    SpO2: 100% 100%    Last Pain:  Vitals:   02/21/17 1340  TempSrc:   PainSc: 0-No pain         Complications: No apparent anesthesia complications
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
## 2017-02-22 NOTE — Op Note (Signed)
Date of Surgery: 02/22/2017  INDICATIONS: Ms. Cerro is a 56 y.o.-year-old female who sustained a left type II open trimalleolar ankle fracture; she was indicated for open reduction and internal fixation due to the displaced nature of the articular fracture and came to the operating room today for this procedure. She initially had operative management proximally 3 weeks ago by myself in an emergent fashion. We performed irrigation debridement as well as internal fixation. Unfortunately at her 2 week postoperative appointment she had widening of the mortise. There appeared to be some loss of the medial fixation. I recommended revision of her syndesmotic fixation. She is here today for that surgery. We have previously discussed the risks benefits and indications of this procedure, including but not limited to bleeding, infection, damage to surrounding neurovascular structures, need for future surgery, malunion, nonunion, development of arthritis, blood clots, and risk of anesthesia. The patient did consent to the procedure after discussion of the risks and benefits.  PREOPERATIVE DIAGNOSIS: left type II open trimalleolar ankle fracture, failure of fixation.  POSTOPERATIVE DIAGNOSIS: Same.  PROCEDURE: Open reduction and internal fixation of left ankle syndesmosis.  SURGEON: Remiel Corti P. Aundria Rud, M.D.  ASSIST: None.  ANESTHESIA:  general, and regional  TOURNIQUET TIME: 36 minutes  IV FLUIDS AND URINE: See anesthesia.  ESTIMATED BLOOD LOSS: 10 mL.  IMPLANTS:  3.5 mm syndesmotic screw, Synthes One 3.5 mm cortical screw, Synthes Arthrex tight rope fixation. Stainless steel.  COMPLICATIONS: None.  DESCRIPTION OF PROCEDURE: The patient was brought to the operating room and placed supine on the operating table.  The patient had been signed prior to the procedure and this was documented. The patient had the anesthesia placed by the anesthesiologist.  A nonsterile tourniquet was placed on the upper  thigh.  The prep verification and incision time-outs were performed to confirm that this was the correct patient, site, side and location. The patient had an SCD on the opposite lower extremity. The patient did receive antibiotics prior to the incision and was re-dosed during the procedure as needed at indicated intervals.  The patient had the lower extremity prepped and draped in the standard surgical fashion.  The extremity was exsanguinated using an esmarch bandage and the tourniquet was inflated to 300 mm Hg.   We began the procedure by performing stress radiography. This did demonstrate widening of the medial clear space consistent with syndesmotic stability. Next we opened the lateral incision using the distal half of the previously made incision. Dissection was carried down to the lateral fibula plate. In the distal cluster of screws there were 3 screws. The proximal 2 screws of these were removed. Next utilizing a King tong clamp the syndesmosis was reduced mechanically. This was performed with the ankle propped up on some sterile towels and the heel of the foot free-floating. Utilizing radiography we confirmed adequate reduction. Next we drilled a clots or cortical pass across the fibula and tibia. The length of the screw was measured by hand and then a quad cortical screw was placed across. The set excellent purchase. The mortise was maintained throughout the entirety of this screw placement. We next proceeded to pass a tight rope device in the next proximal screw. First utilizing a K wire we drilled quadrant cortically across the fibula and tibia. The trajectory of this was parallel to the distal tibial plafond. We then used the cannulated drill from the tight rope set to over drill the K wire. Next the tight rope device was passed across the fibula  and tibia. The cortical Endo button was flipped against the medial tibia. The closed loop fixation device was tightened against the lateral fibula  plate.  Finally the distal most screw of the lateral fibula plate was exchanged from a cancellus screw to a 2 mm longer cortical screw for bicortical fixation. This had excellent purchase.  Next utilizing the C-arm we obtained AP, lateral, and mortise views of the ankle which confirmed adequate reduction of the mortise.  The wounds were irrigated, and closed with vicryl with routine closure utilizing 3-0 nylon in horizontal mattress fashion for the skin. The wounds were injected with local anesthetic. Sterile gauze was applied followed by a posterior splint. She was awakened and returned to the PACU in stable and satisfactory condition. There were no complications.    POSTOPERATIVE PLAN: Ms. Easterwood will remain nonweightbearing on this leg for approximately 8 weeks; Ms. Oberry will return for suture removal in 2 weeks.  She will be immobilized in a short leg splint and then transitioned to a CAM walker at his first follow up appointment.  Ms. Denault will receive DVT prophylaxis based on other medications, activity level, and risk ratio of bleeding to thrombosis.  We have renewed her Lovenox prescription for 3 more weeks.  Maryan Rued, MD [facility] [phone] 10:46 AM

## 2017-02-24 ENCOUNTER — Encounter (HOSPITAL_COMMUNITY): Payer: Self-pay | Admitting: Orthopedic Surgery

## 2017-02-24 NOTE — Anesthesia Postprocedure Evaluation (Signed)
Anesthesia Post Note  Patient: [name]  Procedure(s) Performed: Left ankle hardware removal, syndesmosis internal fixation (Left Ankle)     Patient location during evaluation: PACU Anesthesia Type: General Level of consciousness: awake and alert Pain management: pain level controlled Vital Signs Assessment: post-procedure vital signs reviewed and stable Respiratory status: spontaneous breathing, nonlabored ventilation and respiratory function stable Cardiovascular status: blood pressure returned to baseline and stable Postop Assessment: no apparent nausea or vomiting Anesthetic complications: no    Last Vitals:  Vitals:   02/21/17 1700 02/21/17 1715  BP:    Pulse: 64 68  Resp: 16 19  Temp:    SpO2: 99% 100%    Last Pain:  Vitals:   02/21/17 1600  TempSrc:   PainSc: 0-No pain   Pain Goal:                 [name]

## 2017-09-02 ENCOUNTER — Encounter: Payer: No Typology Code available for payment source | Attending: Physician Assistant | Admitting: Physician Assistant

## 2017-09-02 DIAGNOSIS — Z886 Allergy status to analgesic agent status: Secondary | ICD-10-CM | POA: Insufficient documentation

## 2017-09-02 DIAGNOSIS — I872 Venous insufficiency (chronic) (peripheral): Secondary | ICD-10-CM | POA: Diagnosis not present

## 2017-09-02 DIAGNOSIS — G629 Polyneuropathy, unspecified: Secondary | ICD-10-CM | POA: Insufficient documentation

## 2017-09-02 DIAGNOSIS — Z882 Allergy status to sulfonamides status: Secondary | ICD-10-CM | POA: Diagnosis not present

## 2017-09-02 DIAGNOSIS — S82852E Displaced trimalleolar fracture of left lower leg, subsequent encounter for open fracture type I or II with routine healing: Secondary | ICD-10-CM | POA: Diagnosis not present

## 2017-09-02 DIAGNOSIS — Z885 Allergy status to narcotic agent status: Secondary | ICD-10-CM | POA: Diagnosis not present

## 2017-09-02 DIAGNOSIS — R21 Rash and other nonspecific skin eruption: Secondary | ICD-10-CM | POA: Diagnosis not present

## 2017-09-02 DIAGNOSIS — I10 Essential (primary) hypertension: Secondary | ICD-10-CM | POA: Insufficient documentation

## 2017-09-02 DIAGNOSIS — L97322 Non-pressure chronic ulcer of left ankle with fat layer exposed: Secondary | ICD-10-CM | POA: Insufficient documentation

## 2017-09-02 DIAGNOSIS — Z87891 Personal history of nicotine dependence: Secondary | ICD-10-CM | POA: Diagnosis not present

## 2017-09-02 DIAGNOSIS — Z880 Allergy status to penicillin: Secondary | ICD-10-CM | POA: Insufficient documentation

## 2017-09-04 NOTE — Progress Notes (Signed)
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 Complaints and Symptoms: No Complaints or Symptoms Talbot, Kristy ([id number]) Medical History: Negative for: Gout; Rheumatoid Arthritis; Osteoarthritis; Osteomyelitis Neurologic Complaints and Symptoms: No Complaints or Symptoms Medical History: Positive for: Neuropathy Negative for: Dementia; Quadriplegia; Paraplegia; Seizure Disorder Oncologic Complaints and Symptoms: No Complaints or Symptoms Medical History: Negative for: Received Chemotherapy; Received Radiation Psychiatric Complaints and Symptoms: No Complaints or Symptoms Medical History: Negative for: Anorexia/bulimia HBO Extended History Items Ear/Nose/Mouth/Throat: Chronic sinus problems/congestion Immunizations Pneumococcal Vaccine: Received Pneumococcal Vaccination: No Implantable Devices Family and Social History Cancer: No; Diabetes: No; Heart Disease: No; Hypertension: Yes - Father; Kidney Disease: No; Lung Disease: No; Seizures: No; Stroke:  Yes - Father; Thyroid Problems: No; Tuberculosis: No; Former smoker - 30 + years ago; Marital Status -  Divorced; Alcohol Use: Rarely; Drug Use: No History; Caffeine Use: Rarely; Advanced Directives: No; Patient does not want information on Advanced Directives; Living Will: No; Medical Power of Attorney: No Electronic Signature(s) Signed: 09/02/2017 8:41:23 PM By: Lenda Kelp PA-C Signed: 09/03/2017 1:43:28 PM [name], [name], RN, [name], [name] RN, [name] Entered [name], [name], RN, [name], [name] on 09/02/2017 08:34:49 Aubrey, [name] ([id number]) -------------------------------------------------------------------------------- SuperBill Details Patient Name: [name] Date of Service: 09/02/2017 Medical Record Number: [id number] Patient Account Number: [id number] Date of Birth/Sex: 06/29/1960 (56 y.o. 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 Venous insufficiency (chronic) (peripheral) Facility Procedures CPT4 Code: [id number] Description: 99213 - WOUND CARE VISIT-LEV 3 EST PT Modifier: Quantity: 1 Physician Procedures CPT4: Description Modifier Quantity Code 3086578 WC PHYS LEVEL 3 o NEW PT 1 ICD-10 Diagnosis Description S82.852E Displaced trimalleolar fracture of left lower leg, subsequent encounter for open fracture type I or II with routine healing L97.322  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

## 2017-09-04 NOTE — Progress Notes (Signed)
[name], [name] ([id number]) Visit Report for 09/02/2017 Allergy List Details Patient Name: [name], [name] Date of Service: 09/02/2017 8:00 AM Medical Record Number: [id number] Patient Account Number: [id number] Date of Birth/Sex: 14-Jul-1960 (56 y.o. F) Treating RN: Huel Coventry Primary Care Cecile Guevara: Ruthe Mannan Other Clinician: Referring Ramirez Fullbright: Duwayne Heck Treating Chucky Homes/Extender: Linwood Dibbles, [name] Weeks in Treatment: 0 Allergies Active Allergies aspirin Severity: Severe Dilaudid Severity: Moderate penicillin sulfa drugs Allergy Notes Electronic Signature(s) Signed: 09/03/2017 1:43:28 PM [name], [name], RN, [name], [name] RN, [name] Entered [name], [name], RN, [name], [name] on 09/02/2017 08:29:22 Kelly, Huynh ([id number]) -------------------------------------------------------------------------------- Arrival Information Details Patient Name: [name] Date of Service: 09/02/2017 8:00 AM Medical Record Number: [id number] Patient Account Number: [id number] Date of Birth/Sex: 06/18/1960 (56 y.o. F) Treating RN: Huel Coventry Primary Care Marvelle Span: Ruthe Mannan Other Clinician: Referring Jahlon Baines: Duwayne Heck Treating Merve Hotard/Extender: Linwood Dibbles, [name] Weeks in Treatment: 0 Visit Information Patient Arrived: [place] Mor Arrival Time: 08:13 Accompanied By: Case Worker, English as a second language teacher Assistance: None Patient Identification Verified: Yes Secondary Verification Process Yes Completed: Patient Requires Transmission-Based No Precautions: Patient Has Alerts: Yes Patient Alerts: NOT Diabetic Electronic Signature(s) Signed: 09/03/2017 1:43:28 PM [name], [name], RN, [name], [name] RN, [name] Entered [name], [name], RN, [name], [name] on 09/02/2017 08:14:28 Kelly, Huynh ([id number]) -------------------------------------------------------------------------------- Clinic Level of Care Assessment Details Patient Name: [name] Date of Service: 09/02/2017 8:00 AM Medical Record Number: [id number] Patient  Account Number: [id number] Date of Birth/Sex: April 20, 1961 (56 y.o. F) Treating RN: Phillis Haggis Primary Care Cerita Rabelo: Ruthe Mannan Other Clinician: Referring Mackinley Kiehn: Duwayne Heck Treating Electra Paladino/Extender: Linwood Dibbles, [name] Weeks in Treatment: 0 Clinic Level of Care Assessment Items TOOL 2 Quantity Score X - Use when only an EandM is performed on the INITIAL visit 1 0 ASSESSMENTS - Nursing Assessment / Reassessment X - General Physical Exam (combine w/ comprehensive assessment (listed just below) when 1 20 performed on new pt. evals) X- 1 25 Comprehensive Assessment (HX, ROS, Risk Assessments, Wounds Hx, etc.) ASSESSMENTS - Wound and Skin Assessment / Reassessment X - Simple Wound Assessment / Reassessment - one wound 1 5 []  - 0 Complex Wound Assessment / Reassessment - multiple wounds []  - 0 Dermatologic / Skin Assessment (not related to wound area) ASSESSMENTS - Ostomy and/or Continence Assessment and Care []  - Incontinence Assessment and Management 0 []  - 0 Ostomy Care Assessment and Management (repouching, etc.) PROCESS - Coordination of Care X - Simple Patient / Family Education for ongoing care 1 15 []  - 0 Complex (extensive) Patient / Family Education for ongoing care []  - 0 Staff obtains Chiropractor, Records, Test Results / Process Orders []  - 0 Staff telephones HHA, Nursing Homes / Clarify orders / etc []  - 0 Routine Transfer to another Facility (non-emergent condition) []  - 0 Routine Hospital Admission (non-emergent condition) []  - 0 New Admissions / Manufacturing engineer / Ordering NPWT, Apligraf, etc. []  - 0 Emergency Hospital Admission (emergent condition) X- 1 10 Simple Discharge Coordination []  - 0 Complex (extensive) Discharge Coordination PROCESS - Special Needs []  - Pediatric / Minor Patient Management 0 []  - 0 Isolation Patient Management [name], [name] ([id number]) []  - 0 Hearing / Language / Visual special needs []  - 0 Assessment of  Community assistance (transportation, D/C planning, etc.) []  - 0 Additional assistance / Altered mentation []  - 0 Support Surface(s) Assessment (bed, cushion, seat, etc.) INTERVENTIONS - Wound Cleansing / Measurement X - Wound Imaging (photographs - any number of wounds) 1 5 []  - 0 Wound Tracing (instead of photographs) X-  1 5 Simple Wound Measurement - one wound []  - 0 Complex Wound Measurement - multiple wounds X- 1 5 Simple Wound Cleansing - one wound []  - 0 Complex Wound Cleansing - multiple wounds INTERVENTIONS - Wound Dressings X - Small Wound Dressing one or multiple wounds 1 10 []  - 0 Medium Wound Dressing one or multiple wounds []  - 0 Large Wound Dressing one or multiple wounds []  - 0 Application of Medications - injection INTERVENTIONS - Miscellaneous []  - External ear exam 0 []  - 0 Specimen Collection (cultures, biopsies, blood, body fluids, etc.) []  - 0 Specimen(s) / Culture(s) sent or taken to Lab for analysis []  - 0 Patient Transfer (multiple staff / Nurse, adultHoyer Lift / Similar devices) []  - 0 Simple Staple / Suture removal (25 or less) []  - 0 Complex Staple / Suture removal (26 or more) []  - 0 Hypo / Hyperglycemic Management (close monitor of Blood Glucose) X- 1 15 Ankle / Brachial Index (ABI) - do not check if billed separately Has the patient been seen at the hospital within the last three years: Yes Total Score: 115 Level Of Care: New/Established - Level 3 Electronic Signature(s) Signed: 09/02/2017 5:03:17 PM By: Alejandro MullingPinkerton, Debra Entered By: Alejandro MullingPinkerton, Debra on 09/02/2017 13:19:08 [name] ([id number]) -------------------------------------------------------------------------------- Encounter Discharge Information Details Patient Name: Kelly LawmanANGE, [name] Date of Service: 09/02/2017 8:00 AM Medical Record Number: [id number] Patient Account Number: [id number] Date of Birth/Sex: [birth year] (56 y.o. F) Treating RN: Phillis HaggisPinkerton, Debi Primary Care Shanetta Nicolls: Ruthe MannanAron,  Talia Other Clinician: Referring Marguita Venning: Duwayne HeckOGERS, JASON Treating Jobie Popp/Extender: Linwood DibblesSTONE III, [name] Weeks in Treatment: 0 Encounter Discharge Information Items Facility Notification Discharge Pain Level: 0 Telephoned: Yes Discharge Condition: Stable Orders Sent: Yes Ambulatory Status: Ambulatory Additional Notification Discharge Destination: Home Telephoned: Yes Transportation: Private Auto Orders Sent: Yes Schedule Follow-up Appointment: Yes Medication Reconciliation completed and No provided to Patient/Care Deloros Beretta: Provided on Clinical Summary of Care: 09/02/2017 Form Type Recipient Paper Patient SO Electronic Signature(s) Signed: 09/02/2017 4:09:12 PM By: Renne CriglerFlinchum, Cheryl Entered By: Renne CriglerFlinchum, Cheryl on 09/02/2017 09:27:51 [place] [name] ([id number]) -------------------------------------------------------------------------------- Lower Extremity Assessment Details Patient Name: Kelly LawmanANGE, Farha Date of Service: 09/02/2017 8:00 AM Medical Record Number: [id number] Patient Account Number: [id number] Date of Birth/Sex: [birth year] (56 y.o. F) Treating RN: Huel CoventryWoody, [name] Primary Care Jaeshawn Silvio: Ruthe MannanAron, Talia Other Clinician: Referring Nardos Putnam: Duwayne HeckOGERS, JASON Treating Laquanda Bick/Extender: Linwood DibblesSTONE III, [name] Weeks in Treatment: 0 Edema Assessment Assessed: [Left: No] [Right: No] E[Left: dema] [Right: :] Calf Left: Right: Point of Measurement: 32 cm From Medial Instep 39.5 cm cm Ankle Left: Right: Point of Measurement: 11 cm From Medial Instep 29.8 cm cm Vascular Assessment Pulses: Dorsalis Pedis Palpable: [Left:Yes] Posterior Tibial Palpable: [Left:Yes] Extremity colors, hair growth, and conditions: Extremity Color: [Left:Normal] Hair Growth on Extremity: [Left:Yes] Temperature of Extremity: [Left:Cool] Capillary Refill: [Left:< 3 seconds] Dependent Rubor: [Left:No] Blanched when Elevated: [Left:No] Lipodermatosclerosis: [Left:No] Toe Nail Assessment Left: Right: Thick:  No Discolored: No Deformed: No Improper Length and Hygiene: No Notes Toe nails are painted. Patient has rash surrounding wound area lower leg, ankle. Electronic Signature(s) Signed: 09/03/2017 1:43:28 PM By: Elliot GurneyWoody, [name], RN, [name], [name] RN, [name] Entered By: Elliot GurneyWoody, [name], RN, [name], [name] on 09/02/2017 08:27:36 [place], [name] ([id number]) -------------------------------------------------------------------------------- Multi Wound Chart Details Patient Name: Kelly LawmanANGE, Advika Date of Service: 09/02/2017 8:00 AM Medical Record Number: [id number] Patient Account Number: [id number] Date of Birth/Sex: [birth year] (56 y.o. F) Treating RN: Phillis HaggisPinkerton, Debi Primary Care Shwanda Soltis: Ruthe MannanAron, Talia Other Clinician: Referring Orly Quimby: Duwayne HeckOGERS, JASON Treating Sontee Desena/Extender: Linwood DibblesSTONE III, [name] Weeks in Treatment: 0 Vital Signs  Height(in): 63 Pulse(bpm): 72 Weight(lbs): 167 Blood Pressure(mmHg): 151/78 Body Mass Index(BMI): 30 Temperature(F): 98.1 Respiratory Rate 16 (breaths/min): Photos: [N/A:N/A] Wound Location: Left Ankle - Medial N/A N/A Wounding Event: Surgical Injury N/A N/A Primary Etiology: Trauma, Other N/A N/A Date Acquired: 01/24/2017 N/A N/A Weeks of Treatment: 0 N/A N/A Wound Status: Open N/A N/A Measurements L x W x D 1x6x0.1 N/A N/A (cm) Area (cm) : 4.712 N/A N/A Volume (cm) : 0.471 N/A N/A % Reduction in Area: 0.00% N/A N/A % Reduction in Volume: 0.00% N/A N/A Classification: Full Thickness Without N/A N/A Exposed Support Structures Exudate Amount: Large N/A N/A Exudate Type: Serous N/A N/A Exudate Color: amber N/A N/A Wound Margin: Indistinct, nonvisible N/A N/A Granulation Amount: Small (1-33%) N/A N/A Granulation Quality: Pink N/A N/A Necrotic Amount: None Present (0%) N/A N/A Exposed Structures: Fascia: No N/A N/A Fat Layer (Subcutaneous Tissue) Exposed: No Tendon: No Muscle: No Joint: No Bone: No Epithelialization: Small (1-33%) N/A N/A Periwound Skin  Texture: Excoriation: Yes N/A N/A Induration: No [place], [name] ([id number]) Callus: No Crepitus: No Rash: No Scarring: No Periwound Skin Moisture: Maceration: No N/A N/A Dry/Scaly: No Periwound Skin Color: Atrophie Blanche: No N/A N/A Cyanosis: No Ecchymosis: No Erythema: No Hemosiderin Staining: No Mottled: No Pallor: No Rubor: No Tenderness on Palpation: No N/A N/A Wound Preparation: Ulcer Cleansing: N/A N/A Rinsed/Irrigated with Saline Topical Anesthetic Applied: Other: Lidocaine 4% Treatment Notes Electronic Signature(s) Signed: 09/02/2017 5:03:17 PM By: Alejandro Mulling Entered By: Alejandro Mulling on 09/02/2017 08:47:09 [place], Latessa ([id number]) -------------------------------------------------------------------------------- Multi-Disciplinary Care Plan Details Patient Name: [name] Date of Service: 09/02/2017 8:00 AM Medical Record Number: [id number] Patient Account Number: [id number] Date of Birth/Sex: [birth year] (56 y.o. F) Treating RN: Phillis Haggis Primary Care Moana Munford: Ruthe Mannan Other Clinician: Referring Eustolia Drennen: Duwayne Heck Treating Deloy Archey/Extender: Linwood Dibbles, [name] Weeks in Treatment: 0 Active Inactive ` Orientation to the Wound Care Program Nursing Diagnoses: Knowledge deficit related to the wound healing center program Goals: Patient/caregiver will verbalize understanding of the Wound Healing Center Program Date Initiated: 09/02/2017 Target Resolution Date: 10/04/2017 Goal Status: Active Interventions: Provide education on orientation to the wound center Notes: ` Pain, Acute or Chronic Nursing Diagnoses: Pain, acute or chronic: actual or potential Potential alteration in comfort, pain Goals: Patient/caregiver will verbalize adequate pain control between visits Date Initiated: 09/02/2017 Target Resolution Date: 01/03/2018 Goal Status: Active Interventions: Complete pain assessment as per visit requirements Encourage patient to  take pain medications as prescribed Notes: ` Wound/Skin Impairment Nursing Diagnoses: Impaired tissue integrity Knowledge deficit related to ulceration/compromised skin integrity Goals: Ulcer/skin breakdown will have a volume reduction of 80% by week 12 Date Initiated: 09/02/2017 Target Resolution Date: 12/27/2017 [name], [name] ([id number]) Goal Status: Active Interventions: Assess patient/caregiver ability to perform ulcer/skin care regimen upon admission and as needed Assess ulceration(s) every visit Notes: Electronic Signature(s) Signed: 09/02/2017 5:03:17 PM By: Alejandro Mulling Entered By: Alejandro Mulling on 09/02/2017 08:46:55 [name], [name] ([id number]) -------------------------------------------------------------------------------- Pain Assessment Details Patient Name: [name] Date of Service: 09/02/2017 8:00 AM Medical Record Number: [id number] Patient Account Number: [id number] Date of Birth/Sex: 13-Dec-1960 (56 y.o. F) Treating RN: Huel Coventry Primary Care Jmya Uliano: Ruthe Mannan Other Clinician: Referring Ariel Dimitri: Duwayne Heck Treating Carleigh Buccieri/Extender: Linwood Dibbles, [name] Weeks in Treatment: 0 Active Problems Location of Pain Severity and Description of Pain Patient Has Paino No Site Locations With Dressing Change: No Rate the pain. Current Pain Level: 0 Character of Pain Pain Management and Medication Current Pain Management: Goals for Pain Management Topical or injectable lidocaine is  offered to patient for acute pain when surgical debridement is performed. If needed, Patient is instructed to use over the counter pain medication for the following 24-48 hours after debridement. Wound care MDs do not prescribed pain medications. Patient has chronic pain or uncontrolled pain. Patient has been instructed to make an appointment with their Primary Care Physician for pain management. Electronic Signature(s) Signed: 09/03/2017 1:43:28 PM [name], [name], RN, [name], [name]  RN, [name] Entered [name], [name], RN, [name], [name] on 09/02/2017 08:15:59 [name], [name] ([id number]) -------------------------------------------------------------------------------- Patient/Caregiver Education Details Patient Name: [name] Date of Service: 09/02/2017 8:00 AM Medical Record Number: [id number] Patient Account Number: [id number] Date of Birth/Gender: [birth year] (56 y.o. F) Treating RN: Renne Crigler Primary Care Physician: Ruthe Mannan Other Clinician: Referring Physician: Duwayne Heck Treating Physician/Extender: Skeet Simmer in Treatment: 0 Education Assessment Education Provided To: Patient Education Topics Provided Welcome To The Wound Care Center: Handouts: Welcome To The Wound Care Center Methods: Explain/Verbal Responses: State content correctly Wound/Skin Impairment: Handouts: Caring for Your Ulcer Methods: Explain/Verbal Responses: State content correctly Electronic Signature(s) Signed: 09/02/2017 4:09:12 PM By: Renne Crigler Entered By: Renne Crigler on 09/02/2017 09:28:10 [name], [name] ([id number]) -------------------------------------------------------------------------------- Wound Assessment Details Patient Name: [name] Date of Service: 09/02/2017 8:00 AM Medical Record Number: [id number] Patient Account Number: [id number] Date of Birth/Sex: June 25, 1960 (56 y.o. F) Treating RN: Huel Coventry Primary Care Adler Alton: Ruthe Mannan Other Clinician: Referring Brason Berthelot: Duwayne Heck Treating Marquavion Venhuizen/Extender: Linwood Dibbles, [name] Weeks in Treatment: 0 Wound Status Wound Number: 1 Primary Etiology: Trauma, Other Wound Location: Left Ankle - Medial Wound Status: Open Wounding Event: Surgical Injury Date Acquired: 01/24/2017 Weeks Of Treatment: 0 Clustered Wound: No Photos Photo Uploaded [name], [name], RN, [name], [name] on 09/02/2017 08:46:46 Wound Measurements Length: (cm) 1 Width: (cm) 6 Depth: (cm) 0.1 Area: (cm) 4.712 Volume:  (cm) 0.471 % Reduction in Area: 0% % Reduction in Volume: 0% Epithelialization: Small (1-33%) Tunneling: No Undermining: No Wound Description Full Thickness Without Exposed Support Foul Odor Classification: Structures Slough/Fi Wound Margin: Indistinct, nonvisible Exudate Large Amount: Exudate Type: Serous Exudate Color: amber After Cleansing: No brino No Wound Bed Granulation Amount: Small (1-33%) Exposed Structure Granulation Quality: Pink Fascia Exposed: No Necrotic Amount: None Present (0%) Fat Layer (Subcutaneous Tissue) Exposed: No Tendon Exposed: No Muscle Exposed: No Joint Exposed: No Bone Exposed: No Periwound Skin Texture Texture Color [name], [name] ([id number]) No Abnormalities Noted: No No Abnormalities Noted: No Callus: No Atrophie Blanche: No Crepitus: No Cyanosis: No Excoriation: Yes Ecchymosis: No Induration: No Erythema: No Rash: No Hemosiderin Staining: No Scarring: No Mottled: No Pallor: No Moisture Rubor: No No Abnormalities Noted: No Dry / Scaly: No Maceration: No Wound Preparation Ulcer Cleansing: Rinsed/Irrigated with Saline Topical Anesthetic Applied: Other: Lidocaine 4%, Treatment Notes Wound #1 (Left, Medial Ankle) 1. Cleansed with: Clean wound with Normal Saline 2. Anesthetic Topical Lidocaine 4% cream to wound bed prior to debridement 3. Peri-wound Care: Other peri-wound care (specify in notes) 4. Dressing Applied: Other dressing (specify in notes) 5. Secondary Dressing Applied ABD Pad 7. Secured with 3 Layer Compression System - Left Lower Extremity Notes tca cream to red areas, silvercell with xtrasorb, abd then 3 layer wrap Electronic Signature(s) Signed: 09/03/2017 1:43:28 PM [name], [name], RN, [name], [name] RN, [name] Entered [name], [name], RN, [name], [name] on 09/02/2017 08:25:10 [name], [name] ([id number]) -------------------------------------------------------------------------------- Vitals Details Patient Name:  [name] Date of Service: 09/02/2017 8:00 AM Medical Record Number: [id number] Patient Account Number: [id number] Date of Birth/Sex: [birth year] (56  y.o. F) Treating RN: Huel Coventry Primary Care Haelee Bolen: Ruthe Mannan Other Clinician: Referring Finch Costanzo: Duwayne Heck Treating Indy Kuck/Extender: Linwood Dibbles, [name] Weeks in Treatment: 0 Vital Signs Time Taken: 08:16 Temperature (F): 98.1 Height (in): 63 Pulse (bpm): 72 Source: Measured Respiratory Rate (breaths/min): 16 Weight (lbs): 167 Blood Pressure (mmHg): 151/78 Source: Measured Reference Range: 80 - 120 mg / dl Body Mass Index (BMI): 29.6 Electronic Signature(s) Signed: 09/03/2017 1:43:28 PM [name], [name], RN, [name], [name] RN, [name] Entered [name], [name], RN, [name], [name] on 09/02/2017 08:19:14

## 2017-09-04 NOTE — Progress Notes (Signed)
[name] ([id number]) Visit Report for 09/02/2017 Abuse/Suicide Risk Screen Details Patient Name: [name] Date of Service: 09/02/2017 8:00 AM Medical Record Number: [id number] Patient Account Number: [id number] Date of Birth/Sex: 11-09-1960 (56 y.o. F) Treating RN: Huel Coventry Primary Care [name], [name] Weeks in Treatment: 0 Abuse/Suicide Risk Screen Items Answer ABUSE/SUICIDE RISK SCREEN: Has anyone close to you tried to hurt or harm you recentlyo No Do you feel uncomfortable with anyone in your familyo No Has anyone forced you do things that you didnot want to doo No Do you have any thoughts of harming yourselfo No Patient displays signs or symptoms of abuse and/or neglect. No Electronic Signature(s) Signed: 09/03/2017 1:43:28 PM [name], [name], RN, [name], [name] RN, [name] Entered [name], [name], RN, [name], [name] on 09/02/2017 08:35:00 [name] ([id number]) -------------------------------------------------------------------------------- Activities of Daily Living Details Patient Name: [name] Date of Service: 09/02/2017 8:00 AM Medical Record Number: [id number] Patient Account Number: [id number] Date of Birth/Sex: [birth year] (56 y.o. F) Treating RN: Huel Coventry Primary Care Delores Thelen: Ruthe Mannan Other Clinician: Referring Mistie Adney: Duwayne Heck Treating Lane Eland/Extender: Linwood Dibbles, [name] Weeks in Treatment: 0 Activities of Daily Living Items Answer Activities of Daily Living (Please select one for each item) Drive Automobile Completely Able Take Medications Completely Able Use Telephone Completely Able Care for Appearance Completely Able Use Toilet Completely Able Bath / Shower Completely Able Dress Self Completely Able Feed Self Completely Able Walk Completely Able Get In / Out Bed Completely Able Housework Completely Able Prepare Meals Completely Able Handle  Money Completely Able Shop for Self Completely Able Electronic Signature(s) Signed: 09/03/2017 1:43:28 PM [name], [name], RN, [name], [name] RN, [name] Entered [name], [name], RN, [name], [name] on 09/02/2017 08:35:10 [name] ([id number]) -------------------------------------------------------------------------------- Education Assessment Details Patient Name: [name] Date of Service: 09/02/2017 8:00 AM Medical Record Number: [id number] Patient Account Number: [id number] Date of Birth/Sex: Oct 28, 1960 (56 y.o. F) Treating RN: Huel Coventry Primary Care Cierra Rothgeb: Ruthe Mannan Other Clinician: Referring Kwame Ryland: Duwayne Heck Treating Kelcee Bjorn/Extender: Linwood Dibbles, [name] Weeks in Treatment: 0 Learning Preferences/Education Level/Primary Language Learning Preference: Explanation, Demonstration Highest Education Level: College or Above Preferred Language: English Cognitive Barrier Assessment/Beliefs Language Barrier: No Translator Needed: No Memory Deficit: No Emotional Barrier: No Cultural/Religious Beliefs Affecting Medical Care: No Physical Barrier Assessment Impaired Vision: Yes Glasses Impaired Hearing: No Knowledge/Comprehension Assessment Knowledge Level: High Comprehension Level: High Ability to understand written High instructions: Ability to understand verbal High instructions: Motivation Assessment Anxiety Level: Calm Cooperation: Cooperative Education Importance: Acknowledges Need Interest in Health Problems: Asks Questions Perception: Coherent Willingness to Engage in Self- High Management Activities: Psychologist, prison and probation services) Signed: 09/03/2017 1:43:28 PM [name], [name], RN, [name], [name] RN, [name] Entered [name], [name], RN, [name], [name] on 09/02/2017 08:35:39 [name] ([id number]) -------------------------------------------------------------------------------- Fall Risk Assessment Details Patient Name: [name] Date of Service: 09/02/2017 8:00 AM Medical Record  Number: [id number] Patient Account Number: [id number] Date of Birth/Sex: 25-Oct-1960 (56 y.o. F) Treating RN: Huel Coventry Primary Care Chirstine Defrain: Ruthe Mannan Other Clinician: Referring Seung Nidiffer: Duwayne Heck Treating Jaylene Schrom/Extender: Linwood Dibbles, [name] Weeks in Treatment: 0 Fall Risk Assessment Items Have you had 2 or more falls in the last 12 monthso 0 No Have you had any fall that resulted in injury in the last 12 monthso 0 No FALL RISK ASSESSMENT: History of falling - immediate or within 3 months 0 No Secondary diagnosis 0 No Ambulatory aid None/bed rest/wheelchair/nurse 0 No Crutches/cane/walker 15 Yes  Furniture 0 No IV Access/Saline Lock 0 No Gait/Training Normal/bed rest/immobile 0 Yes Weak 0 No Impaired 0 No Mental Status Oriented to own ability 0 Yes Electronic Signature(s) Signed: 09/03/2017 1:43:28 PM By: Elliot GurneyWoody, [name], RN, [name], [name] RN, [name] Entered By: Elliot GurneyWoody, [name], RN, [name], [name] on 09/02/2017 08:36:15 [name] ([id number]) -------------------------------------------------------------------------------- Foot Assessment Details Patient Name: [name] Date of Service: 09/02/2017 8:00 AM Medical Record Number: [id number] Patient Account Number: [id number] Date of Birth/Sex: 1961-02-22 (56 y.o. F) Treating RN: Huel CoventryWoody, [name] Primary Care Elohim Brune: Ruthe MannanAron, Talia Other Clinician: Referring Nabria Nevin: Duwayne HeckOGERS, JASON Treating Jontue Crumpacker/Extender: Linwood DibblesSTONE III, [name] Weeks in Treatment: 0 Foot Assessment Items Site Locations + = Sensation present, - = Sensation absent, C = Callus, U = Ulcer R = Redness, W = Warmth, M = Maceration, PU = Pre-ulcerative lesion F = Fissure, S = Swelling, D = Dryness Assessment Right: Left: Other Deformity: No No Prior Foot Ulcer: No No Prior Amputation: No No Charcot Joint: No No Ambulatory Status: Ambulatory With Help Assistance Device: Cane GaitFutures trader: Steady Electronic Signature(s) Signed: 09/03/2017 1:43:28 PM By: Elliot GurneyWoody, [name], RN, [name], [name] RN,  [name] Entered By: Elliot GurneyWoody, [name], RN, [name], [name] on 09/02/2017 08:37:29 [place], [name] ([id number]) -------------------------------------------------------------------------------- Nutrition Risk Assessment Details Patient Name: [name] Date of Service: 09/02/2017 8:00 AM Medical Record Number: [id number] Patient Account Number: [id number] Date of Birth/Sex: 1961-02-22 (56 y.o. F) Treating RN: Huel CoventryWoody, [name] Primary Care Aries Townley: Ruthe MannanAron, Talia Other Clinician: Referring Claude Swendsen: Duwayne HeckOGERS, JASON Treating Dayshaun Whobrey/Extender: Linwood DibblesSTONE III, [name] Weeks in Treatment: 0 Height (in): 63 Weight (lbs): 167 Body Mass Index (BMI): 29.6 Nutrition Risk Assessment Items NUTRITION RISK SCREEN: I have an illness or condition that made me change the kind and/or amount of 0 No food I eat I eat fewer than two meals per day 0 No I eat few fruits and vegetables, or milk products 0 No I have three or more drinks of beer, liquor or wine almost every day 0 No I have tooth or mouth problems that make it hard for me to eat 0 No I don't always have enough money to buy the food I need 0 No I eat alone most of the time 0 No I take three or more different prescribed or over-the-counter drugs a day 0 No Without wanting to, I have lost or gained 10 pounds in the last six months 0 No I am not always physically able to shop, cook and/or feed myself 0 No Nutrition Protocols Good Risk Protocol 0 No interventions needed Moderate Risk Protocol Electronic Signature(s) Signed: 09/03/2017 1:43:28 PM By: Elliot GurneyWoody, [name], RN, [name], [name] RN, [name] Entered By: Elliot GurneyWoody, [name], RN, [name], [name] on 09/02/2017 08:36:23

## 2017-09-05 ENCOUNTER — Encounter: Payer: No Typology Code available for payment source | Attending: Physician Assistant

## 2017-09-05 DIAGNOSIS — Z823 Family history of stroke: Secondary | ICD-10-CM | POA: Insufficient documentation

## 2017-09-05 DIAGNOSIS — Z87891 Personal history of nicotine dependence: Secondary | ICD-10-CM | POA: Insufficient documentation

## 2017-09-05 DIAGNOSIS — Z8249 Family history of ischemic heart disease and other diseases of the circulatory system: Secondary | ICD-10-CM | POA: Diagnosis not present

## 2017-09-05 DIAGNOSIS — I878 Other specified disorders of veins: Secondary | ICD-10-CM | POA: Insufficient documentation

## 2017-09-05 DIAGNOSIS — L97329 Non-pressure chronic ulcer of left ankle with unspecified severity: Secondary | ICD-10-CM | POA: Diagnosis not present

## 2017-09-05 DIAGNOSIS — J45909 Unspecified asthma, uncomplicated: Secondary | ICD-10-CM | POA: Diagnosis not present

## 2017-09-07 NOTE — Progress Notes (Signed)
Wynonia LawmanORANGE, Ivanell ([id number]) Visit Report for 09/05/2017 Arrival Information Details Patient Name: Wynonia LawmanORANGE, Ciana Date of Service: 09/05/2017 3:15 PM Medical Record Number: [id number] Patient Account Number: [id number] Date of Birth/Sex: [birth year] (57 y.o. F) Treating RN: Huel CoventryWoody, [name] Primary Care Deronda Christian: Ruthe MannanAron, Talia Other Clinician: Referring Le Faulcon: Ruthe MannanAron, Talia Treating Niobe Dick/Extender: Linwood DibblesSTONE III, [name] Weeks in Treatment: 0 Visit Information History Since Last Visit Added or deleted any medications: No Patient Arrived: Ambulatory Any new allergies or adverse reactions: No Arrival Time: 15:24 Had a fall or experienced change in No Accompanied By: WC case activities of daily living that may affect manager risk of falls: Transfer Assistance: None Signs or symptoms of abuse/neglect since last visito No Patient Identification Verified: Yes Hospitalized since last visit: No Secondary Verification Process Yes Implantable device outside of the clinic excluding No Completed: cellular tissue based products placed in the center Patient Requires Transmission-Based No since last visit: Precautions: Has Dressing in Place as Prescribed: Yes Patient Has Alerts: Yes Pain Present Now: No Patient Alerts: NOT Diabetic Electronic Signature(s) Signed: 09/05/2017 3:50:09 PM By: Elliot GurneyWoody, [name], RN, [name], [name] RN, [name] Entered By: Elliot GurneyWoody, [name], RN, [name], [name] on 09/05/2017 15:24:39

## 2017-09-09 ENCOUNTER — Encounter: Payer: No Typology Code available for payment source | Admitting: Physician Assistant

## 2017-09-09 DIAGNOSIS — L97329 Non-pressure chronic ulcer of left ankle with unspecified severity: Secondary | ICD-10-CM | POA: Diagnosis not present

## 2017-09-15 ENCOUNTER — Encounter: Payer: No Typology Code available for payment source | Admitting: Physician Assistant

## 2017-09-15 DIAGNOSIS — L97329 Non-pressure chronic ulcer of left ankle with unspecified severity: Secondary | ICD-10-CM | POA: Diagnosis not present
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
## 2017-09-19 NOTE — Progress Notes (Signed)
GWENDLOYN, FORSEE ([id number]) Visit Report for 09/15/2017 Arrival Information Details Patient Name: PADDY, WALTHALL Date of Service: 09/15/2017 3:45 PM Medical Record Number: [id number] Patient Account Number: [id number] Date of Birth/Sex: Jul 28, 1960 (57 y.o. F) Treating RN: Curtis Sites Primary Care Ary Lavine: Ruthe Mannan Other Clinician: Referring Krystyn Picking: Ruthe Mannan Treating Esbeydi Manago/Extender: Linwood Dibbles, [name] Weeks in Treatment: 1 Visit Information History Since Last Visit Added or deleted any medications: No Patient Arrived: Ambulatory Any new allergies or adverse reactions: No Arrival Time: 15:59 Had a fall or experienced change in No Accompanied By: workers activities of daily living that may affect comp risk of falls: Transfer Assistance: None Signs or symptoms of abuse/neglect since last visito No Patient Identification Verified: Yes Hospitalized since last visit: No Secondary Verification Process Completed: Yes Implantable device outside of the clinic excluding No Patient Requires Transmission-Based No cellular tissue based products placed in the center Precautions: since last visit: Patient Has Alerts: Yes Has Dressing in Place as Prescribed: Yes Patient Alerts: NOT Diabetic Has Compression in Place as Prescribed: Yes Pain Present Now: No Electronic Signature(s) Signed: 09/16/2017 4:18:46 PM By: Curtis Sites Entered By: Curtis Sites on 09/15/2017 15:59:42 [place], [place] ([id number]) -------------------------------------------------------------------------------- Encounter Discharge Information Details Patient Name: [name] Date of Service: 09/15/2017 3:45 PM Medical Record Number: [id number] Patient Account Number: [id number] Date of Birth/Sex: 1960/09/10 (56 y.o. F) Treating RN: Renne Crigler Primary Care Letti Towell: Ruthe Mannan Other Clinician: Referring Vimal Derego: Ruthe Mannan Treating Lenzi Marmo/Extender: Linwood Dibbles, [name] Weeks in Treatment:  1 Encounter Discharge Information Items Discharge Pain Level: 0 Discharge Condition: Stable Ambulatory Status: Ambulatory Discharge Destination: Home Transportation: Private Auto Schedule Follow-up Appointment: Yes Medication Reconciliation completed and No provided to Patient/Care Draiden Mirsky: Provided on Clinical Summary of Care: 09/15/2017 Form Type Recipient Paper Patient SO Electronic Signature(s) Signed: 09/15/2017 4:40:07 PM By: Renne Crigler Entered By: Renne Crigler on 09/15/2017 16:32:22 [place], Raniah ([id number]) -------------------------------------------------------------------------------- Lower Extremity Assessment Details Patient Name: [name] Date of Service: 09/15/2017 3:45 PM Medical Record Number: [id number] Patient Account Number: [id number] Date of Birth/Sex: [birth year] (56 y.o. F) Treating RN: Curtis Sites Primary Care Frenchie Dangerfield: Ruthe Mannan Other Clinician: Referring Grisel Blumenstock: Ruthe Mannan Treating Kayela Humphres/Extender: Linwood Dibbles, [name] Weeks in Treatment: 1 Edema Assessment Assessed: [Left: No] [Right: No] E[Left: dema] [Right: :] Calf Left: Right: Point of Measurement: 32 cm From Medial Instep 38.1 cm cm Ankle Left: Right: Point of Measurement: 11 cm From Medial Instep 25.3 cm cm Vascular Assessment Pulses: Dorsalis Pedis Palpable: [Left:Yes] Posterior Tibial Extremity colors, hair growth, and conditions: Extremity Color: [Left:Normal] Hair Growth on Extremity: [Left:Yes] Temperature of Extremity: [Left:Warm] Capillary Refill: [Left:< 3 seconds] Electronic Signature(s) Signed: 09/16/2017 4:18:46 PM By: Curtis Sites Entered By: Curtis Sites on 09/15/2017 16:06:55 [place], Jezlyn ([id number]) -------------------------------------------------------------------------------- Multi Wound Chart Details Patient Name: [name] Date of Service: 09/15/2017 3:45 PM Medical Record Number: [id number] Patient Account Number: [id number] Date  of Birth/Sex: 03/17/1961 (57 y.o. F) Treating RN: Renne Crigler Primary Care Khalel Alms: Ruthe Mannan Other Clinician: Referring Piotr Christopher: Ruthe Mannan Treating Shayla Heming/Extender: Linwood Dibbles, [name] Weeks in Treatment: 1 Vital Signs Height(in): 63 Pulse(bpm): 77 Weight(lbs): 167 Blood Pressure(mmHg): 131/72 Body Mass Index(BMI): 30 Temperature(F): 98.5 Respiratory Rate 18 (breaths/min): Wound Assessments Treatment Notes Electronic Signature(s) Signed: 09/15/2017 4:40:07 PM By: Renne Crigler Entered By: Renne Crigler on 09/15/2017 16:29:47 ALEANA, FIFITA ([id number]) -------------------------------------------------------------------------------- Multi-Disciplinary Care Plan Details Patient Name: [name] Date of Service: 09/15/2017 3:45 PM Medical Record Number: [id number] Patient Account Number: [id number] Date of Birth/Sex: 28-Jan-1961 (56 y.o. F) Treating RN: Renne Crigler Primary Care  Hargun Spurling: Ruthe MannanAron, Talia Other Clinician: Referring Laurynn Mccorvey: Ruthe MannanAron, Talia Treating [place] Lebeau/Extender: Linwood DibblesSTONE III, [name] Weeks in Treatment: 1 Active Inactive ` Orientation to the Wound Care Program Nursing Diagnoses: Knowledge deficit related to the wound healing center program Goals: Patient/caregiver will verbalize understanding of the Wound Healing Center Program Date Initiated: 09/02/2017 Target Resolution Date: 10/04/2017 Goal Status: Active Interventions: Provide education on orientation to the wound center Notes: ` Pain, Acute or Chronic Nursing Diagnoses: Pain, acute or chronic: actual or potential Potential alteration in comfort, pain Goals: Patient/caregiver will verbalize adequate pain control between visits Date Initiated: 09/02/2017 Target Resolution Date: 01/03/2018 Goal Status: Active Interventions: Complete pain assessment as per visit requirements Encourage patient to take pain medications as prescribed Notes: ` Wound/Skin Impairment Nursing  Diagnoses: Impaired tissue integrity Knowledge deficit related to ulceration/compromised skin integrity Goals: Ulcer/skin breakdown will have a volume reduction of 80% by week 12 Date Initiated: 09/02/2017 Target Resolution Date: 12/27/2017 Wynonia LawmanORANGE, Symone ([id number]) Goal Status: Active Interventions: Assess patient/caregiver ability to perform ulcer/skin care regimen upon admission and as needed Assess ulceration(s) every visit Notes: Electronic Signature(s) Signed: 09/15/2017 4:40:07 PM By: Renne CriglerFlinchum, Cheryl Entered By: Renne CriglerFlinchum, Cheryl on 09/15/2017 16:29:39 [place], [place] ([id number]) -------------------------------------------------------------------------------- Pain Assessment Details Patient Name: Wynonia LawmanANGE, Cieanna Date of Service: 09/15/2017 3:45 PM Medical Record Number: [id number] Patient Account Number: [id number] Date of Birth/Sex: [birth year] (56 y.o. F) Treating RN: Curtis Sitesorthy, Joanna Primary Care Bess Saltzman: Ruthe MannanAron, Talia Other Clinician: Referring Maria Coin: Ruthe MannanAron, Talia Treating Quantarius Genrich/Extender: Linwood DibblesSTONE III, [name] Weeks in Treatment: 1 Active Problems Location of Pain Severity and Description of Pain Patient Has Paino No Site Locations Pain Management and Medication Current Pain Management: Electronic Signature(s) Signed: 09/16/2017 4:18:46 PM By: Curtis Sitesorthy, Joanna Entered By: Curtis Sitesorthy, Joanna on 09/15/2017 15:59:52 [place] ([id number]) -------------------------------------------------------------------------------- Patient/Caregiver Education Details Patient Name: Wynonia LawmanANGE, Almee Date of Service: 09/15/2017 3:45 PM Medical Record Number: [id number] Patient Account Number: [id number] Date of Birth/Gender: [birth year] (56 y.o. F) Treating RN: Renne CriglerFlinchum, Cheryl Primary Care Physician: Ruthe MannanAron, Talia Other Clinician: Referring Physician: Ruthe MannanAron, Talia Treating Physician/Extender: Linwood DibblesSTONE III, [name] Weeks in Treatment: 1 Education Assessment Education Provided To: Patient Education  Topics Provided Wound/Skin Impairment: Handouts: Caring for Your Ulcer, Skin Care Do's and Dont's Methods: Explain/Verbal Responses: State content correctly Electronic Signature(s) Signed: 09/15/2017 4:40:07 PM By: Renne CriglerFlinchum, Cheryl Entered By: Renne CriglerFlinchum, Cheryl on 09/15/2017 16:32:36 [place], Kiele ([id number]) -------------------------------------------------------------------------------- Vitals Details Patient Name: Wynonia LawmanANGE, Manette Date of Service: 09/15/2017 3:45 PM Medical Record Number: [id number] Patient Account Number: [id number] Date of Birth/Sex: [birth year] (56 y.o. F) Treating RN: Curtis Sitesorthy, Joanna Primary Care Alban Marucci: Ruthe MannanAron, Talia Other Clinician: Referring Girtie Wiersma: Ruthe MannanAron, Talia Treating Jehiel Koepp/Extender: Linwood DibblesSTONE III, [name] Weeks in Treatment: 1 Vital Signs Time Taken: 16:00 Temperature (F): 98.5 Height (in): 63 Pulse (bpm): 77 Weight (lbs): 167 Respiratory Rate (breaths/min): 18 Body Mass Index (BMI): 29.6 Blood Pressure (mmHg): 131/72 Reference Range: 80 - 120 mg / dl Electronic Signature(s) Signed: 09/16/2017 4:18:46 PM By: Curtis Sitesorthy, Joanna Entered By: Curtis Sitesorthy, Joanna on 09/15/2017 16:00:50
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
## 2017-09-23 ENCOUNTER — Encounter: Payer: No Typology Code available for payment source | Admitting: Physician Assistant

## 2017-09-23 DIAGNOSIS — L97329 Non-pressure chronic ulcer of left ankle with unspecified severity: Secondary | ICD-10-CM | POA: Diagnosis not present
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
## 2017-09-27 NOTE — Progress Notes (Signed)
[name], [name] ([id number]) Visit Report for 09/23/2017 Arrival Information Details Patient Name: [name], [name] Date of Service: 09/23/2017 8:30 AM Medical Record Number: [id number] Patient Account Number: [id number] Date of Birth/Sex: 04-16-1961 (57 y.o. F) Treating RN: Renne Crigler Primary Care Nkenge Sonntag: Ruthe Mannan Other Clinician: Referring Diamond Jentz: Ruthe Mannan Treating Hesston Hitchens/Extender: Linwood Dibbles, [name] Weeks in Treatment: 3 Visit Information History Since Last Visit All ordered tests and consults were completed: No Patient Arrived: Gilmer Mor Added or deleted any medications: No Arrival Time: 08:35 Any new allergies or adverse reactions: No Accompanied By: worker comp Had a fall or experienced change in No Transfer Assistance: None activities of daily living that may affect Patient Identification Verified: Yes risk of falls: Secondary Verification Process Completed: Yes Signs or symptoms of abuse/neglect since last visito No Patient Requires Transmission-Based No Hospitalized since last visit: No Precautions: Implantable device outside of the clinic excluding No Patient Has Alerts: Yes cellular tissue based products placed in the center Patient Alerts: NOT since last visit: Diabetic Pain Present Now: No Electronic Signature(s) Signed: 09/23/2017 3:57:53 PM By: Renne Crigler Entered By: Renne Crigler on 09/23/2017 08:38:00 Bylas, [name] ([id number]) -------------------------------------------------------------------------------- Clinic Level of Care Assessment Details Patient Name: [name], [name] Date of Service: 09/23/2017 8:30 AM Medical Record Number: [id number] Patient Account Number: [id number] Date of Birth/Sex: Oct 12, 1960 (57 y.o. F) Treating RN: Phillis Haggis Primary Care Chesnee Floren: Ruthe Mannan Other Clinician: Referring Billyjack Trompeter: Ruthe Mannan Treating Alex Leahy/Extender: Linwood Dibbles, [name] Weeks in Treatment: 3 Clinic Level of Care Assessment Items TOOL 4  Quantity Score X - Use when only an EandM is performed on FOLLOW-UP visit 1 0 ASSESSMENTS - Nursing Assessment / Reassessment X - Reassessment of Co-morbidities (includes updates in patient status) 1 10 X- 1 5 Reassessment of Adherence to Treatment Plan ASSESSMENTS - Wound and Skin Assessment / Reassessment X - Simple Wound Assessment / Reassessment - one wound 1 5  - 0 Complex Wound Assessment / Reassessment - multiple wounds  - 0 Dermatologic / Skin Assessment (not related to wound area) ASSESSMENTS - Focused Assessment  - Circumferential Edema Measurements - multi extremities 0  - 0 Nutritional Assessment / Counseling / Intervention  - 0 Lower Extremity Assessment (monofilament, tuning fork, pulses)  - 0 Peripheral Arterial Disease Assessment (using hand held doppler) ASSESSMENTS - Ostomy and/or Continence Assessment and Care  - Incontinence Assessment and Management 0  - 0 Ostomy Care Assessment and Management (repouching, etc.) PROCESS - Coordination of Care X - Simple Patient / Family Education for ongoing care 1 15  - 0 Complex (extensive) Patient / Family Education for ongoing care  - 0 Staff obtains Chiropractor, Records, Test Results / Process Orders  - 0 Staff telephones HHA, Nursing Homes / Clarify orders / etc  - 0 Routine Transfer to another Facility (non-emergent condition)  - 0 Routine Hospital Admission (non-emergent condition)  - 0 New Admissions / Manufacturing engineer / Ordering NPWT, Apligraf, etc.  - 0 Emergency Hospital Admission (emergent condition) X- 1 10 Simple Discharge Coordination [name], [name] ([id number])  - 0 Complex (extensive) Discharge Coordination PROCESS - Special Needs  - Pediatric / Minor Patient Management 0  - 0 Isolation Patient Management  - 0 Hearing / Language / Visual special needs  - 0 Assessment of Community assistance (transportation, D/C planning, etc.)  - 0 Additional  assistance / Altered mentation  - 0 Support Surface(s) Assessment (bed, cushion, seat, etc.) INTERVENTIONS - Wound Cleansing / Measurement X - Simple Wound Cleansing - one wound 1 5  - 0  Complex Wound Cleansing - multiple wounds X- 1 5 Wound Imaging (photographs - any number of wounds)  - 0 Wound Tracing (instead of photographs)  - 0 Simple Wound Measurement - one wound  - 0 Complex Wound Measurement - multiple wounds INTERVENTIONS - Wound Dressings  - Small Wound Dressing one or multiple wounds 0  - 0 Medium Wound Dressing one or multiple wounds  - 0 Large Wound Dressing one or multiple wounds  - 0 Application of Medications - topical  - 0 Application of Medications - injection INTERVENTIONS - Miscellaneous  - External ear exam 0  - 0 Specimen Collection (cultures, biopsies, blood, body fluids, etc.)  - 0 Specimen(s) / Culture(s) sent or taken to Lab for analysis  - 0 Patient Transfer (multiple staff / Nurse, adult / Similar devices)  - 0 Simple Staple / Suture removal (25 or less)  - 0 Complex Staple / Suture removal (26 or more)  - 0 Hypo / Hyperglycemic Management (close monitor of Blood Glucose)  - 0 Ankle / Brachial Index (ABI) - do not check if billed separately X- 1 5 Vital Signs [name], [name] ([id number]) Has the patient been seen at the hospital within the last three years: Yes Total Score: 60 Level Of Care: New/Established - Level 2 Electronic Signature(s) Signed: 09/24/2017 4:27:21 PM By: Alejandro Mulling Entered By: Alejandro Mulling on 09/23/2017 10:35:16 [name], [name] ([id number]) -------------------------------------------------------------------------------- Encounter Discharge Information Details Patient Name: [name] Date of Service: 09/23/2017 8:30 AM Medical Record Number: [id number] Patient Account Number: [id number] Date of Birth/Sex: [birth year] (56 y.o. F) Treating RN: Phillis Haggis Primary Care  Abrahim Sargent: Ruthe Mannan Other Clinician: Referring Jeidi Gilles: Ruthe Mannan Treating Tyresse Jayson/Extender: Linwood Dibbles, [name] Weeks in Treatment: 3 Encounter Discharge Information Items Discharge Pain Level: 0 Discharge Condition: Stable Ambulatory Status: Ambulatory Discharge Destination: Home Transportation: Private Auto Accompanied By: caseworker Schedule Follow-up Appointment: Yes Medication Reconciliation completed and No provided to Patient/Care Josecarlos Harriott: Provided on Clinical Summary of Care: 09/23/2017 Form Type Recipient Paper Patient SO Electronic Signature(s) Signed: 09/24/2017 8:52:06 AM By: Gwenlyn Perking Entered By: Gwenlyn Perking on 09/23/2017 09:18:04 [name], [name] ([id number]) -------------------------------------------------------------------------------- Multi Wound Chart Details Patient Name: [name] Date of Service: 09/23/2017 8:30 AM Medical Record Number: [id number] Patient Account Number: [id number] Date of Birth/Sex: 02-Nov-1960 (56 y.o. F) Treating RN: Phillis Haggis Primary Care Shawonda Kerce: Ruthe Mannan Other Clinician: Referring Tawana Pasch: Ruthe Mannan Treating Uzziah Rigg/Extender: Linwood Dibbles, [name] Weeks in Treatment: 3 Vital Signs Height(in): 63 Pulse(bpm): 72 Weight(lbs): 167 Blood Pressure(mmHg): 143/96 Body Mass Index(BMI): 30 Temperature(F): 98.4 Respiratory Rate 18 (breaths/min): Wound Assessments Treatment Notes Electronic Signature(s) Signed: 09/24/2017 4:27:21 PM By: Alejandro Mulling Entered By: Alejandro Mulling on 09/23/2017 09:06:28 [name], [name] ([id number]) -------------------------------------------------------------------------------- Multi-Disciplinary Care Plan Details Patient Name: [name] Date of Service: 09/23/2017 8:30 AM Medical Record Number: [id number] Patient Account Number: [id number] Date of Birth/Sex: 09/12/1960 (56 y.o. F) Treating RN: Phillis Haggis Primary Care Erika Hussar: Ruthe Mannan Other Clinician: Referring  Chrisanne Loose: Ruthe Mannan Treating Keiandre Cygan/Extender: Linwood Dibbles, [name] Weeks in Treatment: 3 Active Inactive Electronic Signature(s) Signed: 09/24/2017 4:27:21 PM By: Alejandro Mulling Entered By: Alejandro Mulling on 09/23/2017 09:06:18 [place], Nyja ([id number]) -------------------------------------------------------------------------------- Pain Assessment Details Patient Name: [name] Date of Service: 09/23/2017 8:30 AM Medical Record Number: [id number] Patient Account Number: [id number] Date of Birth/Sex: [birth year] (56 y.o. F) Treating RN: Renne Crigler Primary Care Toshie Demelo: Ruthe Mannan Other Clinician: Referring Elaria Osias: Ruthe Mannan Treating Jupiter Boys/Extender: Linwood Dibbles, [name] Weeks in Treatment: 3 Active Problems Location of Pain Severity and Description of Pain Patient  Has Paino No Site Locations Pain Management and Medication Current Pain Management: Electronic Signature(s) Signed: 09/23/2017 3:57:53 PM By: Renne Crigler Entered By: Renne Crigler on 09/23/2017 08:38:07 [name], [name] ([id number]) -------------------------------------------------------------------------------- Patient/Caregiver Education Details Patient Name: [name] Date of Service: 09/23/2017 8:30 AM Medical Record Number: [id number] Patient Account Number: [id number] Date of Birth/Gender: 01/26/1961 (56 y.o. F) Treating RN: Phillis Haggis Primary Care Physician: Ruthe Mannan Other Clinician: Referring Physician: Ruthe Mannan Treating Physician/Extender: Linwood Dibbles, [name] Weeks in Treatment: 3 Education Assessment Education Provided To: Patient caseworker Education Topics Provided Wound/Skin Impairment: Handouts: Other: Wear your compression daily, put on first thing when you wake up and remove before bed. Methods: Explain/Verbal Responses: State content correctly Electronic Signature(s) Signed: 09/24/2017 4:27:21 PM By: Alejandro Mulling Entered By: Alejandro Mulling on 09/23/2017  09:08:41 [name], [name] ([id number]) -------------------------------------------------------------------------------- Vitals Details Patient Name: [name] Date of Service: 09/23/2017 8:30 AM Medical Record Number: [id number] Patient Account Number: [id number] Date of Birth/Sex: Jun 30, 1960 (56 y.o. F) Treating RN: Renne Crigler Primary Care Rance Smithson: Ruthe Mannan Other Clinician: Referring Sharnette Kitamura: Ruthe Mannan Treating Ranferi Clingan/Extender: Linwood Dibbles, [name] Weeks in Treatment: 3 Vital Signs Time Taken: 08:38 Temperature (F): 98.4 Height (in): 63 Pulse (bpm): 72 Weight (lbs): 167 Respiratory Rate (breaths/min): 18 Body Mass Index (BMI): 29.6 Blood Pressure (mmHg): 143/96 Reference Range: 80 - 120 mg / dl Electronic Signature(s) Signed: 09/23/2017 3:57:53 PM By: Renne Crigler Entered By: Renne Crigler on 09/23/2017 08:38:29

## 2018-02-09 LAB — BASIC METABOLIC PANEL
BUN: 13 (ref 4–21)
CREATININE: 1.1 (ref 0.5–1.1)
Glucose: 96
Potassium: 4.2 (ref 3.4–5.3)
SODIUM: 142 (ref 137–147)

## 2018-02-09 LAB — CBC AND DIFFERENTIAL
HCT: 43 (ref 36–46)
HEMOGLOBIN: 14.2 (ref 12.0–16.0)
Platelets: 277 (ref 150–399)
WBC: 4.8

## 2018-02-09 LAB — TSH: TSH: 4.23 (ref 0.41–5.90)

## 2018-02-09 LAB — HEPATIC FUNCTION PANEL
ALT: 18 (ref 7–35)
AST: 14 (ref 13–35)
Alkaline Phosphatase: 79 (ref 25–125)
Bilirubin, Total: 0.5

## 2018-02-18 ENCOUNTER — Encounter: Payer: Self-pay | Admitting: Family Medicine

## 2018-02-18 NOTE — Progress Notes (Signed)
[facility]/thx dmf 

## 2018-02-25 ENCOUNTER — Encounter (HOSPITAL_COMMUNITY): Payer: Self-pay | Admitting: Emergency Medicine

## 2018-02-25 ENCOUNTER — Ambulatory Visit (HOSPITAL_COMMUNITY)
Admission: EM | Admit: 2018-02-25 | Discharge: 2018-02-25 | Disposition: A | Discharge status: Home / Self Care | Payer: BC Managed Care – PPO | Attending: Family Medicine | Admitting: Family Medicine

## 2018-02-25 DIAGNOSIS — L509 Urticaria, unspecified: Secondary | ICD-10-CM | POA: Insufficient documentation

## 2018-02-25 MED ORDER — PREDNISONE 10 MG (21) PO TBPK: ORAL_TABLET | Freq: Every day | ORAL | 0 refills | Status: DC
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
## 2018-02-25 NOTE — ED Triage Notes (Signed)
Pt here for rash to chest and back with itching

## 2018-04-05 ENCOUNTER — Encounter (HOSPITAL_COMMUNITY): Payer: Self-pay | Admitting: Emergency Medicine

## 2018-04-05 ENCOUNTER — Ambulatory Visit (HOSPITAL_COMMUNITY)
Admission: EM | Admit: 2018-04-05 | Discharge: 2018-04-05 | Disposition: A | Discharge status: Home / Self Care | Payer: BC Managed Care – PPO | Attending: Family Medicine | Admitting: Family Medicine

## 2018-04-05 DIAGNOSIS — I872 Venous insufficiency (chronic) (peripheral): Secondary | ICD-10-CM | POA: Diagnosis not present

## 2018-04-05 MED ORDER — TRIAMCINOLONE ACETONIDE 0.1 % EX CREA: 1.0000 "application " | TOPICAL_CREAM | Freq: Two times a day (BID) | CUTANEOUS | 1 refills | Status: DC

## 2018-04-05 NOTE — Discharge Instructions (Addendum)
We will go ahead and treat this with steroid cream. You can place the cream on the legs and then wrap with ace wraps from the ankle up.  You need to follow up with your PCP they may want to place you on a medication to help with the swelling.  I am going to give you something stronger for itching. This medication may make you drowsy.
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
## 2018-04-05 NOTE — ED Triage Notes (Signed)
Pt here for rash to legs x 1 month

## 2018-04-09 IMAGING — RF DG C-ARM 61-120 MIN
1 series · 3 of 3 positions shown · non-contrast
Comparison: None.

CLINICAL DATA: ORIF of left ankle

EXAM:
DG C-ARM 61-120 MIN; LEFT ANKLE COMPLETE - 3+ VIEW

[Series 1: run · 3 of 3 slices shown]
[im 1/3]
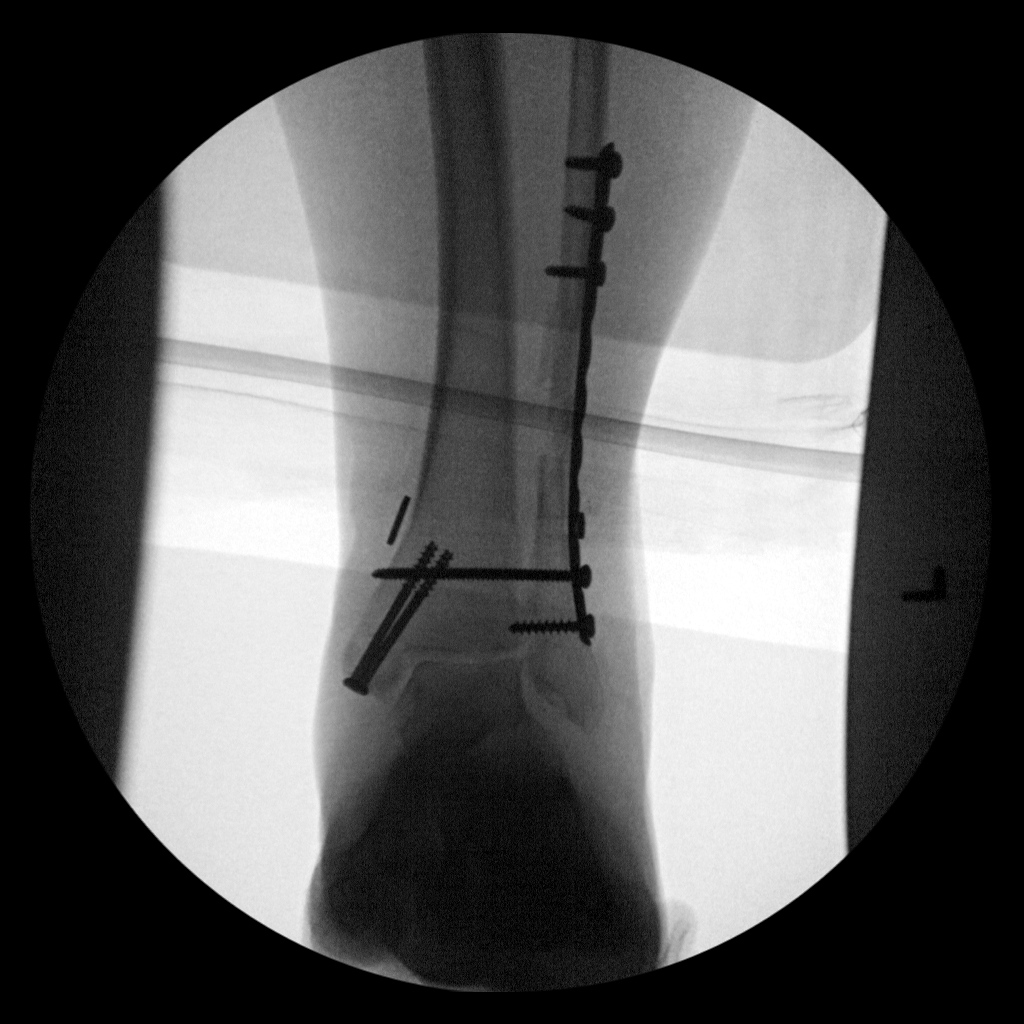
[im 2/3]
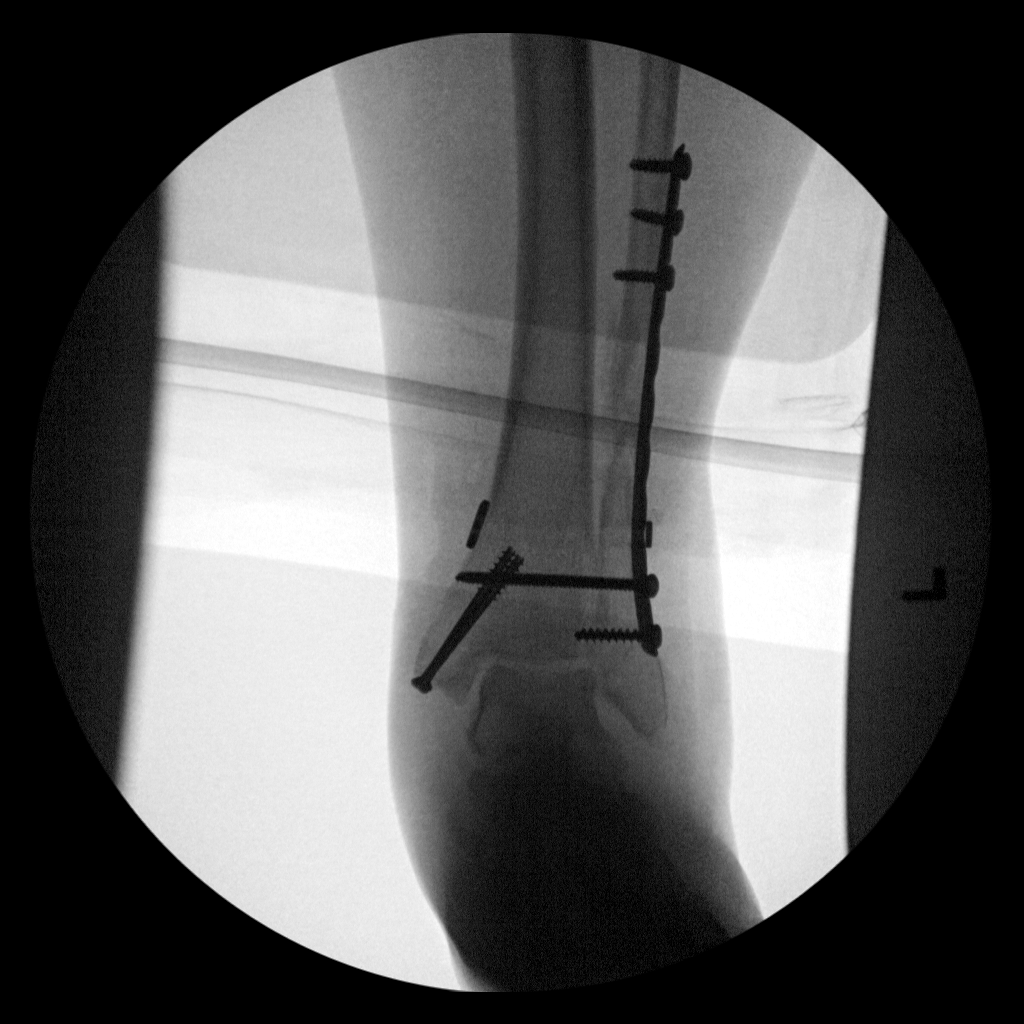
[im 3/3]
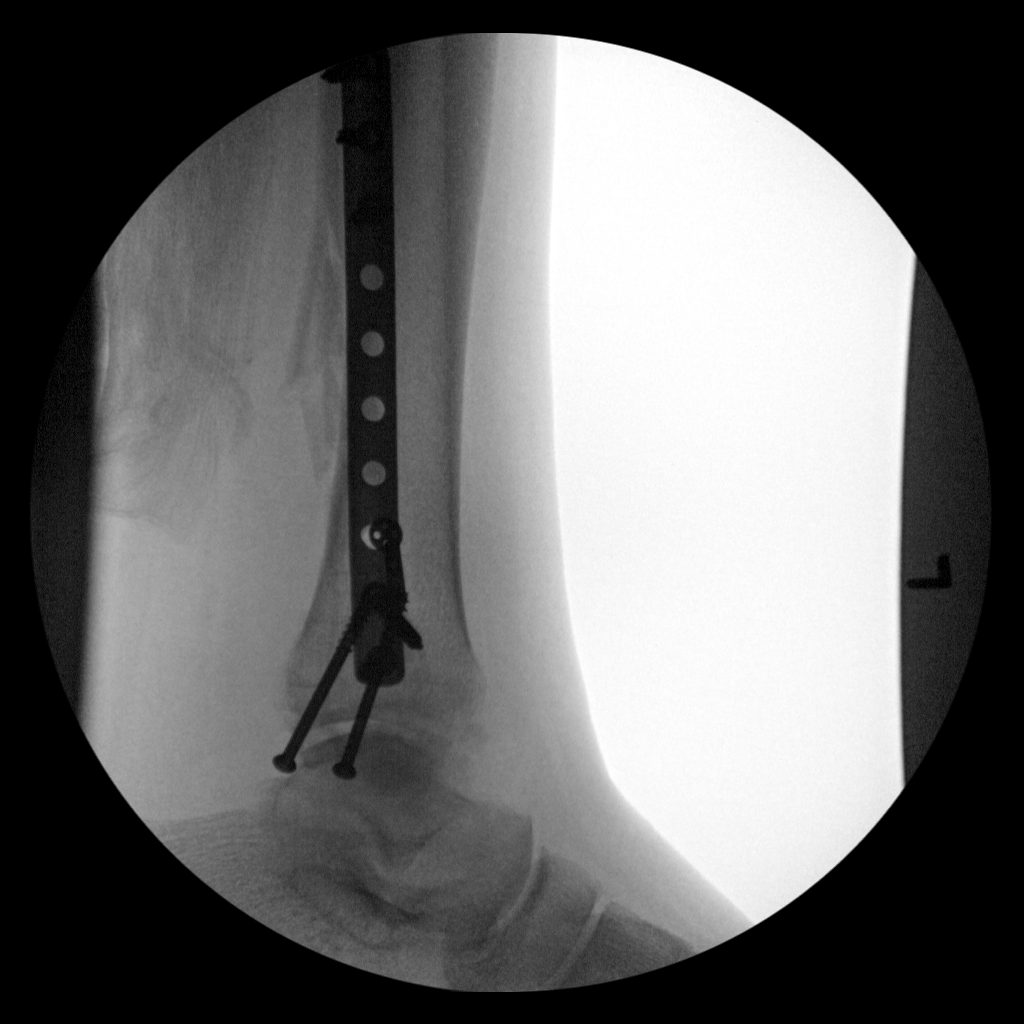

[3 of 3 positions shown; findings below may reference images not displayed]

FLUOROSCOPY TIME:  Fluoroscopy Time:  15 seconds

Radiation Exposure Index (if provided by the fluoroscopic device):
Not available

Number of Acquired Spot Images: 3
FINDINGS: Fixation sideplate is noted along the distal fibula. Some callus
formation is seen. Multiple fixation screws are noted as well as a
fixation device traversing both the fibula and tibia.
IMPRESSION: ORIF of distal fibular fracture

## 2018-07-25 ENCOUNTER — Other Ambulatory Visit: Payer: Self-pay

## 2018-07-25 ENCOUNTER — Encounter (HOSPITAL_COMMUNITY): Payer: Self-pay | Admitting: Emergency Medicine

## 2018-07-25 ENCOUNTER — Inpatient Hospital Stay (HOSPITAL_COMMUNITY)
Admission: EM | Admit: 2018-07-25 | Discharge: 2018-07-27 | DRG: 603 | Disposition: A | Discharge status: Home / Self Care | Payer: BC Managed Care – PPO | Attending: Internal Medicine | Admitting: Internal Medicine

## 2018-07-25 ENCOUNTER — Observation Stay (HOSPITAL_BASED_OUTPATIENT_CLINIC_OR_DEPARTMENT_OTHER): Payer: BC Managed Care – PPO

## 2018-07-25 DIAGNOSIS — M7989 Other specified soft tissue disorders: Secondary | ICD-10-CM

## 2018-07-25 DIAGNOSIS — I878 Other specified disorders of veins: Secondary | ICD-10-CM | POA: Diagnosis present

## 2018-07-25 DIAGNOSIS — L03115 Cellulitis of right lower limb: Principal | ICD-10-CM | POA: Diagnosis present

## 2018-07-25 DIAGNOSIS — Z6830 Body mass index (BMI) 30.0-30.9, adult: Secondary | ICD-10-CM

## 2018-07-25 DIAGNOSIS — I10 Essential (primary) hypertension: Secondary | ICD-10-CM | POA: Diagnosis present

## 2018-07-25 DIAGNOSIS — L0390 Cellulitis, unspecified: Secondary | ICD-10-CM | POA: Diagnosis present

## 2018-07-25 DIAGNOSIS — Z880 Allergy status to penicillin: Secondary | ICD-10-CM

## 2018-07-25 DIAGNOSIS — N183 Chronic kidney disease, stage 3 unspecified: Secondary | ICD-10-CM | POA: Diagnosis present

## 2018-07-25 DIAGNOSIS — E66811 Obesity, class 1: Secondary | ICD-10-CM | POA: Diagnosis present

## 2018-07-25 DIAGNOSIS — Z8249 Family history of ischemic heart disease and other diseases of the circulatory system: Secondary | ICD-10-CM

## 2018-07-25 DIAGNOSIS — R609 Edema, unspecified: Secondary | ICD-10-CM

## 2018-07-25 DIAGNOSIS — Z820 Family history of epilepsy and other diseases of the nervous system: Secondary | ICD-10-CM

## 2018-07-25 DIAGNOSIS — Z886 Allergy status to analgesic agent status: Secondary | ICD-10-CM

## 2018-07-25 DIAGNOSIS — E872 Acidosis, unspecified: Secondary | ICD-10-CM | POA: Diagnosis present

## 2018-07-25 DIAGNOSIS — E669 Obesity, unspecified: Secondary | ICD-10-CM | POA: Diagnosis present

## 2018-07-25 DIAGNOSIS — Z882 Allergy status to sulfonamides status: Secondary | ICD-10-CM

## 2018-07-25 DIAGNOSIS — L03119 Cellulitis of unspecified part of limb: Secondary | ICD-10-CM

## 2018-07-25 DIAGNOSIS — Z8349 Family history of other endocrine, nutritional and metabolic diseases: Secondary | ICD-10-CM

## 2018-07-25 DIAGNOSIS — L409 Psoriasis, unspecified: Secondary | ICD-10-CM | POA: Diagnosis present

## 2018-07-25 DIAGNOSIS — L03116 Cellulitis of left lower limb: Secondary | ICD-10-CM | POA: Diagnosis present

## 2018-07-25 DIAGNOSIS — I872 Venous insufficiency (chronic) (peripheral): Secondary | ICD-10-CM | POA: Diagnosis present

## 2018-07-25 DIAGNOSIS — I129 Hypertensive chronic kidney disease with stage 1 through stage 4 chronic kidney disease, or unspecified chronic kidney disease: Secondary | ICD-10-CM | POA: Diagnosis present

## 2018-07-25 HISTORY — DX: Chronic kidney disease, stage 3 (moderate): N18.3

## 2018-07-25 HISTORY — DX: Chronic kidney disease, stage 3 unspecified: N18.30

## 2018-07-25 LAB — CBC WITH DIFFERENTIAL/PLATELET
Abs Immature Granulocytes: 0.03 10*3/uL (ref 0.00–0.07)
Basophils Absolute: 0 10*3/uL (ref 0.0–0.1)
Basophils Relative: 0 %
Eosinophils Absolute: 1.1 10*3/uL — ABNORMAL HIGH (ref 0.0–0.5)
Eosinophils Relative: 12 %
HCT: 43.2 % (ref 36.0–46.0)
Hemoglobin: 14.1 g/dL (ref 12.0–15.0)
Immature Granulocytes: 0 %
Lymphocytes Relative: 13 %
Lymphs Abs: 1.2 10*3/uL (ref 0.7–4.0)
MCH: 26.7 pg (ref 26.0–34.0)
MCHC: 32.6 g/dL (ref 30.0–36.0)
MCV: 81.8 fL (ref 80.0–100.0)
MONOS PCT: 8 %
Monocytes Absolute: 0.8 10*3/uL (ref 0.1–1.0)
Neutro Abs: 6.3 10*3/uL (ref 1.7–7.7)
Neutrophils Relative %: 67 %
Platelets: 280 10*3/uL (ref 150–400)
RBC: 5.28 MIL/uL — ABNORMAL HIGH (ref 3.87–5.11)
RDW: 13.1 % (ref 11.5–15.5)
WBC: 9.5 10*3/uL (ref 4.0–10.5)
nRBC: 0 % (ref 0.0–0.2)

## 2018-07-25 LAB — COMPREHENSIVE METABOLIC PANEL
ALK PHOS: 75 U/L (ref 38–126)
ALT: 14 U/L (ref 0–44)
AST: 19 U/L (ref 15–41)
Albumin: 3.9 g/dL (ref 3.5–5.0)
Anion gap: 9 (ref 5–15)
BUN: 8 mg/dL (ref 6–20)
CALCIUM: 9.2 mg/dL (ref 8.9–10.3)
CO2: 22 mmol/L (ref 22–32)
Chloride: 108 mmol/L (ref 98–111)
Creatinine, Ser: 1.24 mg/dL — ABNORMAL HIGH (ref 0.44–1.00)
GFR calc Af Amer: 55 mL/min — ABNORMAL LOW (ref >60)
GFR calc non Af Amer: 48 mL/min — ABNORMAL LOW (ref >60)
GLUCOSE: 123 mg/dL — AB (ref 70–99)
Potassium: 3.6 mmol/L (ref 3.5–5.1)
Sodium: 139 mmol/L (ref 135–145)
Total Bilirubin: 0.6 mg/dL (ref 0.3–1.2)
Total Protein: 6.1 g/dL — ABNORMAL LOW (ref 6.5–8.1)

## 2018-07-25 LAB — HIV ANTIBODY (ROUTINE TESTING W REFLEX): HIV Screen 4th Generation wRfx: NONREACTIVE

## 2018-07-25 LAB — LACTIC ACID, PLASMA
Lactic Acid, Venous: 1.1 mmol/L (ref 0.5–1.9)
Lactic Acid, Venous: 1.3 mmol/L (ref 0.5–1.9)
Lactic Acid, Venous: 2.1 mmol/L (ref 0.5–1.9)

## 2018-07-25 MED ORDER — HYDROCODONE-ACETAMINOPHEN 5-325 MG PO TABS: 1.0000 | ORAL_TABLET | Freq: Four times a day (QID) | ORAL | Status: DC | PRN

## 2018-07-25 MED ORDER — HYDROCODONE-ACETAMINOPHEN 5-325 MG PO TABS
2.0000 | ORAL_TABLET | Freq: Once | ORAL | Status: AC
  Administered 2018-07-25: 2 via ORAL
  Filled 2018-07-25: qty 2

## 2018-07-25 MED ORDER — SODIUM CHLORIDE 0.9% FLUSH
3.0000 mL | Freq: Once | INTRAVENOUS | Status: AC
  Administered 2018-07-25: 3 mL via INTRAVENOUS

## 2018-07-25 MED ORDER — SODIUM CHLORIDE 0.9 % IV SOLN
INTRAVENOUS | Status: AC
  Administered 2018-07-25: 10:00:00 via INTRAVENOUS

## 2018-07-25 MED ORDER — CLINDAMYCIN PHOSPHATE 600 MG/50ML IV SOLN
600.0000 mg | Freq: Once | INTRAVENOUS | Status: AC
  Administered 2018-07-25: 600 mg via INTRAVENOUS
  Filled 2018-07-25: qty 50

## 2018-07-25 MED ORDER — ENOXAPARIN SODIUM 40 MG/0.4ML ~~LOC~~ SOLN
40.0000 mg | Freq: Every day | SUBCUTANEOUS | Status: DC
  Administered 2018-07-25 – 2018-07-27 (×3): 40 mg via SUBCUTANEOUS
  Filled 2018-07-25 (×3): qty 0.4

## 2018-07-25 MED ORDER — CLINDAMYCIN PHOSPHATE 600 MG/50ML IV SOLN
600.0000 mg | Freq: Three times a day (TID) | INTRAVENOUS | Status: DC
  Administered 2018-07-25 – 2018-07-27 (×6): 600 mg via INTRAVENOUS
  Filled 2018-07-25 (×6): qty 50

## 2018-07-25 MED ORDER — ACETAMINOPHEN 325 MG PO TABS: 650.0000 mg | ORAL_TABLET | Freq: Four times a day (QID) | ORAL | Status: DC | PRN

## 2018-07-25 MED ORDER — ACETAMINOPHEN 650 MG RE SUPP: 650.0000 mg | Freq: Four times a day (QID) | RECTAL | Status: DC | PRN
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
## 2018-07-25 NOTE — Progress Notes (Signed)
Patient admitted to [address] with the diagnosis of cellulitis. A & O x 4. Denied any acute pain at this moment. Patient oriented to her room,ascom/call bell and staff. Full assessment to epic completed. Patient and son voiced no concern. Will continue to monitor.

## 2018-07-25 NOTE — Progress Notes (Signed)
VASCULAR LAB PRELIMINARY  PRELIMINARY  PRELIMINARY  PRELIMINARY  Bilateral lower extremity venous duplex completed.    Preliminary report:  See CV Proc  Leda Bellefeuille, RVT 07/25/2018, 1:09 PM

## 2018-07-25 NOTE — Plan of Care (Signed)
  Problem: Pain Managment: Goal: General experience of comfort will improve Outcome: Progressing   Problem: Safety: Goal: Ability to remain free from injury will improve Outcome: Progressing   Problem: Education: Goal: Knowledge of General Education information will improve Description Including pain rating scale, medication(s)/side effects and non-pharmacologic comfort measures Outcome: Progressing   Problem: Health Behavior/Discharge Planning: Goal: Ability to manage health-related needs will improve Outcome: Progressing   Problem: Skin Integrity: Goal: Risk for impaired skin integrity will decrease Outcome: Progressing
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
## 2018-07-26 DIAGNOSIS — L03119 Cellulitis of unspecified part of limb: Secondary | ICD-10-CM | POA: Diagnosis not present

## 2018-07-26 DIAGNOSIS — R609 Edema, unspecified: Secondary | ICD-10-CM | POA: Diagnosis not present

## 2018-07-26 DIAGNOSIS — N183 Chronic kidney disease, stage 3 (moderate): Secondary | ICD-10-CM | POA: Diagnosis not present

## 2018-07-26 DIAGNOSIS — I10 Essential (primary) hypertension: Secondary | ICD-10-CM | POA: Diagnosis not present

## 2018-07-26 LAB — BASIC METABOLIC PANEL
Anion gap: 7 (ref 5–15)
BUN: 10 mg/dL (ref 6–20)
CO2: 24 mmol/L (ref 22–32)
Calcium: 8.6 mg/dL — ABNORMAL LOW (ref 8.9–10.3)
Chloride: 109 mmol/L (ref 98–111)
Creatinine, Ser: 1.12 mg/dL — ABNORMAL HIGH (ref 0.44–1.00)
GFR calc Af Amer: >60 mL/min (ref >60)
GFR calc non Af Amer: 54 mL/min — ABNORMAL LOW (ref >60)
Glucose, Bld: 103 mg/dL — ABNORMAL HIGH (ref 70–99)
Potassium: 4.1 mmol/L (ref 3.5–5.1)
Sodium: 140 mmol/L (ref 135–145)

## 2018-07-26 LAB — CBC
HEMATOCRIT: 38.6 % (ref 36.0–46.0)
Hemoglobin: 12.6 g/dL (ref 12.0–15.0)
MCH: 27 pg (ref 26.0–34.0)
MCHC: 32.6 g/dL (ref 30.0–36.0)
MCV: 82.7 fL (ref 80.0–100.0)
Platelets: 233 10*3/uL (ref 150–400)
RBC: 4.67 MIL/uL (ref 3.87–5.11)
RDW: 13.4 % (ref 11.5–15.5)
WBC: 5.5 10*3/uL (ref 4.0–10.5)
nRBC: 0 % (ref 0.0–0.2)

## 2018-07-26 MED ORDER — HYDROCERIN EX CREA
TOPICAL_CREAM | Freq: Every day | CUTANEOUS | Status: DC
  Administered 2018-07-26 – 2018-07-27 (×2): via TOPICAL
  Filled 2018-07-26: qty 113

## 2018-07-26 MED ORDER — NAPHAZOLINE-PHENIRAMINE 0.025-0.3 % OP SOLN
1.0000 [drp] | Freq: Two times a day (BID) | OPHTHALMIC | Status: DC | PRN
  Filled 2018-07-26: qty 15

## 2018-07-26 MED ORDER — ALBUTEROL SULFATE (2.5 MG/3ML) 0.083% IN NEBU: 3.0000 mL | INHALATION_SOLUTION | Freq: Four times a day (QID) | RESPIRATORY_TRACT | Status: DC | PRN

## 2018-07-26 MED ORDER — TRIAMCINOLONE ACETONIDE 0.1 % EX CREA
1.0000 "application " | TOPICAL_CREAM | Freq: Two times a day (BID) | CUTANEOUS | Status: DC | PRN
  Filled 2018-07-26: qty 15

## 2018-07-26 MED ORDER — PANTOPRAZOLE SODIUM 40 MG PO TBEC
40.0000 mg | DELAYED_RELEASE_TABLET | Freq: Every day | ORAL | Status: DC
  Administered 2018-07-26 – 2018-07-27 (×2): 40 mg via ORAL
  Filled 2018-07-26 (×2): qty 1

## 2018-07-26 MED ORDER — NAPHAZOLINE-PHENIRAMINE 0.027-0.315 % OP SOLN: 1.0000 [drp] | Freq: Two times a day (BID) | OPHTHALMIC | Status: DC | PRN

## 2018-07-26 MED ORDER — MONTELUKAST SODIUM 10 MG PO TABS
10.0000 mg | ORAL_TABLET | Freq: Every day | ORAL | Status: DC
  Administered 2018-07-26: 10 mg via ORAL
  Filled 2018-07-26 (×2): qty 1
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
## 2018-07-26 NOTE — Consult Note (Signed)
WOC Nurse wound consult note Reason for Consult: Bilateral LE edema, cellulitis Wound type: infectious, venous insufficiency without wounds. Pressure Injury POA:NA Measurement: N/A Wound bed:N/A Drainage (amount, consistency, odor) N/A Periwound: resolving edema, erythema.  No warmth. Dressing procedure/placement/frequency: I will implement a topical emollient (Eucerin cream) once daily after cleansing and following with light compression using a dry boot (Kerlix/ACE). Continue to elevate LEs.  WOC nursing team will not follow, but will remain available to this patient, the nursing and medical teams.  Please re-consult if needed. Thanks, Ladona Mow, MSN, RN, GNP, [place]  Pager# [phone]

## 2018-07-27 DIAGNOSIS — L409 Psoriasis, unspecified: Secondary | ICD-10-CM

## 2018-07-27 DIAGNOSIS — E872 Acidosis: Secondary | ICD-10-CM

## 2018-07-27 DIAGNOSIS — Z880 Allergy status to penicillin: Secondary | ICD-10-CM | POA: Diagnosis not present

## 2018-07-27 DIAGNOSIS — Z6830 Body mass index (BMI) 30.0-30.9, adult: Secondary | ICD-10-CM | POA: Diagnosis not present

## 2018-07-27 DIAGNOSIS — L03115 Cellulitis of right lower limb: Secondary | ICD-10-CM | POA: Diagnosis present

## 2018-07-27 DIAGNOSIS — I10 Essential (primary) hypertension: Secondary | ICD-10-CM | POA: Diagnosis not present

## 2018-07-27 DIAGNOSIS — I872 Venous insufficiency (chronic) (peripheral): Secondary | ICD-10-CM | POA: Diagnosis present

## 2018-07-27 DIAGNOSIS — Z886 Allergy status to analgesic agent status: Secondary | ICD-10-CM | POA: Diagnosis not present

## 2018-07-27 DIAGNOSIS — L03116 Cellulitis of left lower limb: Secondary | ICD-10-CM

## 2018-07-27 DIAGNOSIS — Z820 Family history of epilepsy and other diseases of the nervous system: Secondary | ICD-10-CM | POA: Diagnosis not present

## 2018-07-27 DIAGNOSIS — I129 Hypertensive chronic kidney disease with stage 1 through stage 4 chronic kidney disease, or unspecified chronic kidney disease: Secondary | ICD-10-CM | POA: Diagnosis present

## 2018-07-27 DIAGNOSIS — I878 Other specified disorders of veins: Secondary | ICD-10-CM | POA: Diagnosis present

## 2018-07-27 DIAGNOSIS — Z8249 Family history of ischemic heart disease and other diseases of the circulatory system: Secondary | ICD-10-CM | POA: Diagnosis not present

## 2018-07-27 DIAGNOSIS — E669 Obesity, unspecified: Secondary | ICD-10-CM | POA: Diagnosis present

## 2018-07-27 DIAGNOSIS — Z882 Allergy status to sulfonamides status: Secondary | ICD-10-CM | POA: Diagnosis not present

## 2018-07-27 DIAGNOSIS — Z8349 Family history of other endocrine, nutritional and metabolic diseases: Secondary | ICD-10-CM | POA: Diagnosis not present

## 2018-07-27 DIAGNOSIS — R609 Edema, unspecified: Secondary | ICD-10-CM | POA: Diagnosis present

## 2018-07-27 DIAGNOSIS — N183 Chronic kidney disease, stage 3 (moderate): Secondary | ICD-10-CM | POA: Diagnosis present

## 2018-07-27 MED ORDER — HYDROCERIN EX CREA: 1.0000 "application " | TOPICAL_CREAM | Freq: Every day | CUTANEOUS | 0 refills | Status: AC

## 2018-07-27 MED ORDER — CLINDAMYCIN HCL 300 MG PO CAPS: 300.0000 mg | ORAL_CAPSULE | Freq: Three times a day (TID) | ORAL | 0 refills | Status: DC

## 2018-07-27 MED ORDER — CLINDAMYCIN HCL 300 MG PO CAPS
300.0000 mg | ORAL_CAPSULE | Freq: Three times a day (TID) | ORAL | Status: DC
  Administered 2018-07-27: 300 mg via ORAL
  Filled 2018-07-27: qty 1

## 2018-07-27 MED ORDER — TRIAMCINOLONE ACETONIDE 0.1 % EX CREA: 1.0000 "application " | TOPICAL_CREAM | Freq: Two times a day (BID) | CUTANEOUS | Status: DC | PRN

## 2018-07-27 NOTE — Progress Notes (Signed)
Pt given discharge instructions and gone over with her. BLE dressings changed and instructed pt on how to do it when she gets home, pt demonstrated understanding. Supplies given to her for dressing changes. All belongings gathered to be sent home.

## 2018-07-27 NOTE — Discharge Instructions (Signed)

## 2018-07-28 DIAGNOSIS — E872 Acidosis, unspecified: Secondary | ICD-10-CM | POA: Diagnosis present

## 2018-07-28 DIAGNOSIS — E669 Obesity, unspecified: Secondary | ICD-10-CM | POA: Diagnosis present

## 2018-07-28 DIAGNOSIS — L409 Psoriasis, unspecified: Secondary | ICD-10-CM | POA: Diagnosis present
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
## 2018-08-04 ENCOUNTER — Ambulatory Visit: Payer: BC Managed Care – PPO | Admitting: Internal Medicine

## 2018-08-04 ENCOUNTER — Encounter: Payer: Self-pay | Admitting: Internal Medicine

## 2018-08-04 VITALS — BP 138/88 | HR 85 | Temp 97.8°F | Wt 187.0 lb

## 2018-08-04 DIAGNOSIS — K219 Gastro-esophageal reflux disease without esophagitis: Secondary | ICD-10-CM | POA: Diagnosis not present

## 2018-08-04 DIAGNOSIS — L03116 Cellulitis of left lower limb: Secondary | ICD-10-CM | POA: Diagnosis not present

## 2018-08-04 DIAGNOSIS — I10 Essential (primary) hypertension: Secondary | ICD-10-CM | POA: Diagnosis not present

## 2018-08-04 DIAGNOSIS — L03115 Cellulitis of right lower limb: Secondary | ICD-10-CM

## 2018-08-04 DIAGNOSIS — N183 Chronic kidney disease, stage 3 unspecified: Secondary | ICD-10-CM

## 2018-08-04 MED ORDER — LISINOPRIL-HYDROCHLOROTHIAZIDE 10-12.5 MG PO TABS: 1.0000 | ORAL_TABLET | Freq: Every day | ORAL | 2 refills | Status: DC

## 2018-08-04 MED ORDER — CLINDAMYCIN HCL 300 MG PO CAPS: 300.0000 mg | ORAL_CAPSULE | Freq: Three times a day (TID) | ORAL | 0 refills | Status: DC

## 2018-08-04 NOTE — Assessment & Plan Note (Signed)
Will start Lisinopril HCT for kidney protection, edema Reinforced DASH diet and exercise for weight loss Will check CMET in 2 weeks.
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
## 2018-08-04 NOTE — Assessment & Plan Note (Signed)
Continue Omeprazole for now Discussed how weight loss could help improve reflux

## 2018-08-04 NOTE — Assessment & Plan Note (Signed)
Start Lisinopril for renal protection CMET in 2 weeks

## 2018-08-04 NOTE — Patient Instructions (Signed)

## 2018-08-06 ENCOUNTER — Inpatient Hospital Stay: Payer: Self-pay | Admitting: Internal Medicine

## 2018-08-10 ENCOUNTER — Telehealth: Payer: Self-pay | Admitting: Internal Medicine

## 2018-08-10 NOTE — Telephone Encounter (Signed)
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 From 3.17.20 until 3.27.20. Please advise pt when this is done. Pt need a callback by end of day.
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
## 2018-08-11 NOTE — Telephone Encounter (Signed)
She can't stand for long periods of time why? Because of her cellulitis? If it's not better she needs to be seen. I'm not gonna just write her out of work for 10 days.

## 2018-08-12 NOTE — Telephone Encounter (Signed)
Pt has appt scheduled tomorrow

## 2018-08-12 NOTE — Telephone Encounter (Signed)
She needs to be seen for reevaulation before she will be written out of work and this is not a guarantee that if she is seen for reevaluation, she will not necessarily be written out work.

## 2018-08-13 ENCOUNTER — Other Ambulatory Visit: Payer: Self-pay

## 2018-08-13 ENCOUNTER — Ambulatory Visit: Payer: BC Managed Care – PPO | Admitting: Internal Medicine

## 2018-08-13 ENCOUNTER — Encounter: Payer: Self-pay | Admitting: Internal Medicine

## 2018-08-13 VITALS — BP 142/84 | HR 85 | Temp 98.1°F | Wt 185.0 lb

## 2018-08-13 DIAGNOSIS — L03119 Cellulitis of unspecified part of limb: Secondary | ICD-10-CM

## 2018-08-13 MED ORDER — DOXYCYCLINE HYCLATE 100 MG PO TABS: 100.0000 mg | ORAL_TABLET | Freq: Two times a day (BID) | ORAL | 0 refills | Status: DC

## 2018-08-13 MED ORDER — FUROSEMIDE 40 MG PO TABS: 40.0000 mg | ORAL_TABLET | Freq: Every day | ORAL | 0 refills | Status: DC

## 2018-08-13 NOTE — Patient Instructions (Signed)
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
## 2018-08-18 ENCOUNTER — Ambulatory Visit: Payer: BC Managed Care – PPO | Admitting: Internal Medicine

## 2018-08-18 ENCOUNTER — Ambulatory Visit: Payer: Self-pay | Admitting: Internal Medicine

## 2018-08-28 ENCOUNTER — Other Ambulatory Visit: Payer: Self-pay | Admitting: Internal Medicine

## 2018-11-28 ENCOUNTER — Other Ambulatory Visit: Payer: Self-pay | Admitting: Internal Medicine

## 2018-12-14 ENCOUNTER — Other Ambulatory Visit: Payer: Self-pay | Admitting: Internal Medicine

## 2019-01-12 ENCOUNTER — Ambulatory Visit (HOSPITAL_COMMUNITY)
Admission: EM | Admit: 2019-01-12 | Discharge: 2019-01-12 | Disposition: A | Discharge status: Home / Self Care | Payer: BC Managed Care – PPO | Attending: Family Medicine | Admitting: Family Medicine

## 2019-01-12 ENCOUNTER — Other Ambulatory Visit: Payer: Self-pay

## 2019-01-12 ENCOUNTER — Encounter (HOSPITAL_COMMUNITY): Payer: Self-pay | Admitting: Emergency Medicine

## 2019-01-12 DIAGNOSIS — R21 Rash and other nonspecific skin eruption: Secondary | ICD-10-CM

## 2019-01-12 MED ORDER — CLINDAMYCIN HCL 300 MG PO CAPS: 300.0000 mg | ORAL_CAPSULE | Freq: Three times a day (TID) | ORAL | 0 refills | Status: AC

## 2019-01-12 MED ORDER — TRIAMCINOLONE ACETONIDE 0.1 % EX CREA: 1.0000 "application " | TOPICAL_CREAM | Freq: Two times a day (BID) | CUTANEOUS | 0 refills | Status: AC

## 2019-01-12 MED ORDER — PREDNISONE 50 MG PO TABS: 50.0000 mg | ORAL_TABLET | Freq: Every day | ORAL | 0 refills | Status: AC
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
## 2019-01-12 NOTE — ED Triage Notes (Signed)
Pt here for wound check to left foot after having poison ivy that is now red and draining

## 2019-01-12 NOTE — Discharge Instructions (Addendum)
Begin clindamycin 3 times daily for the next week Prednisone daily for 5 days with food Apply kenalog cream twice daily Elevate leg  Follow up if symptoms not resolving

## 2019-02-25 ENCOUNTER — Ambulatory Visit: Payer: BC Managed Care – PPO
